# Patient Record
Sex: Female | Born: 1950
Health system: Southern US, Community
[De-identification: ages and names within clinical notes are randomized; demographics above are authoritative.]

## PROBLEM LIST (undated history)

## (undated) DIAGNOSIS — Z9989 Dependence on other enabling machines and devices: Principal | ICD-10-CM

## (undated) DIAGNOSIS — T7840XA Allergy, unspecified, initial encounter: Secondary | ICD-10-CM

## (undated) DIAGNOSIS — F32A Depression, unspecified: Secondary | ICD-10-CM

## (undated) DIAGNOSIS — G4733 Obstructive sleep apnea (adult) (pediatric): Secondary | ICD-10-CM

## (undated) DIAGNOSIS — F329 Major depressive disorder, single episode, unspecified: Secondary | ICD-10-CM

## (undated) DIAGNOSIS — S060X9A Concussion with loss of consciousness of unspecified duration, initial encounter: Secondary | ICD-10-CM

## (undated) HISTORY — DX: Obstructive sleep apnea (adult) (pediatric): G47.33

## (undated) HISTORY — DX: Concussion with loss of consciousness of unspecified duration, initial encounter: S06.0X9A

## (undated) HISTORY — DX: Major depressive disorder, single episode, unspecified: F32.9

## (undated) HISTORY — DX: Dependence on other enabling machines and devices: Z99.89

## (undated) HISTORY — DX: Depression, unspecified: F32.A

## (undated) HISTORY — PX: BLEPHAROPLASTY: SUR158

## (undated) HISTORY — DX: Allergy, unspecified, initial encounter: T78.40XA

## (undated) HISTORY — PX: TONSILLECTOMY: SUR1361

---

## 1998-04-17 ENCOUNTER — Emergency Department (HOSPITAL_COMMUNITY): Admission: EM | Admit: 1998-04-17 | Discharge: 1998-04-17 | Payer: Self-pay | Admitting: Emergency Medicine

## 2003-09-24 HISTORY — PX: OTHER SURGICAL HISTORY: SHX169

## 2013-01-11 ENCOUNTER — Ambulatory Visit (INDEPENDENT_AMBULATORY_CARE_PROVIDER_SITE_OTHER): Payer: BC Managed Care – PPO | Admitting: Emergency Medicine

## 2013-01-11 VITALS — BP 120/80 | HR 74 | Temp 98.1°F | Resp 16 | Ht 65.0 in | Wt 179.0 lb

## 2013-01-11 DIAGNOSIS — F329 Major depressive disorder, single episode, unspecified: Secondary | ICD-10-CM

## 2013-01-11 MED ORDER — PAROXETINE HCL 20 MG PO TABS: 20.0000 mg | ORAL_TABLET | ORAL | Status: DC

## 2013-01-11 NOTE — Progress Notes (Signed)
[facility] [address] [place] [address] [phone]  Date:  01/11/2013   Name:  Cathy Gallegos   DOB:  [birth year]   MRN:  [id number]  PCP:  No primary provider on file.    Chief Complaint: Allergies and Depression   History of Present Illness:  Cathy Gallegos is a 62 y.o. very pleasant female patient who presents with the following:  Experiencing vertigo since middle of March that has been poorly controlled with OTC nonsedating antihistamines.  Had similar symptoms previously.  Thinks it is related to allergies.  Also affected by depression that has worsened.  No medications currently.  Took prozac years ago and stopped it for no good reason.  No suicidal thoughts or thoughts of self harm or harm to others.  Now unemployed as she quit her job a year ago.  Gaining weight and sleeping excessively.  Wants to be by herself and is less social than she was.    Has no nasal congestion or drainage, cough, fever or chills.  No nausea or vomiting.  No weakness, wheezing or shortness of breath.  No headaches,  No improvement with over the counter medications or other home remedies. Denies other complaint or health concern today.   There is no problem list on file for this patient.   Past Medical History  Diagnosis Date  . Allergy     Past Surgical History  Procedure Laterality Date  . Cesarean section      History  Substance Use Topics  . Smoking status: Never Smoker   . Smokeless tobacco: Not on file  . Alcohol Use: No    No family history on file.  Allergies  Allergen Reactions  . Latex Swelling    Medication list has been reviewed and updated.  No current outpatient prescriptions on file prior to visit.   No current facility-administered medications on file prior to visit.    Review of Systems:  As per HPI, otherwise negative.    Physical Examination: Filed Vitals:   01/11/13 1001  BP: 120/80  Pulse: 74  Temp: 98.1 F (36.7 C)   Resp: 16   Filed Vitals:   01/11/13 1001  Height: 5\' 5"  (1.651 m)  Weight: 179 lb (81.194 kg)   Body mass index is 29.79 kg/(m^2). Ideal Body Weight: Weight in (lb) to have BMI = 25: 149.9  GEN: WDWN, NAD, Non-toxic, A & O x 3 HEENT: Atraumatic, Normocephalic. Neck supple. No masses, No LAD. Ears and Nose: No external deformity. CV: RRR, No M/G/R. No JVD. No thrill. No extra heart sounds. PULM: CTA B, no wheezes, crackles, rhonchi. No retractions. No resp. distress. No accessory muscle use. ABD: S, NT, ND, +BS. No rebound. No HSM. EXTR: No c/c/e NEURO Normal gait.  PSYCH: Normally interactive. Conversant. Not depressed or anxious appearing.  Calm demeanor.    Assessment and Plan: Depression paxil Resolved benign positional vertigo Follow up as needed  Signed,  Phillips Odor, MD

## 2013-01-11 NOTE — Patient Instructions (Addendum)

## 2013-05-20 ENCOUNTER — Ambulatory Visit (INDEPENDENT_AMBULATORY_CARE_PROVIDER_SITE_OTHER): Payer: BC Managed Care – PPO | Admitting: Family Medicine

## 2013-05-20 ENCOUNTER — Ambulatory Visit: Payer: BC Managed Care – PPO

## 2013-05-20 VITALS — BP 132/70 | HR 70 | Temp 98.2°F | Resp 18 | Ht 65.0 in | Wt 175.0 lb

## 2013-05-20 DIAGNOSIS — M25539 Pain in unspecified wrist: Secondary | ICD-10-CM

## 2013-05-20 DIAGNOSIS — M25512 Pain in left shoulder: Secondary | ICD-10-CM

## 2013-05-20 DIAGNOSIS — F329 Major depressive disorder, single episode, unspecified: Secondary | ICD-10-CM

## 2013-05-20 DIAGNOSIS — M25532 Pain in left wrist: Secondary | ICD-10-CM

## 2013-05-20 DIAGNOSIS — R42 Dizziness and giddiness: Secondary | ICD-10-CM

## 2013-05-20 DIAGNOSIS — R110 Nausea: Secondary | ICD-10-CM

## 2013-05-20 DIAGNOSIS — M25519 Pain in unspecified shoulder: Secondary | ICD-10-CM

## 2013-05-20 LAB — POCT CBC
HCT, POC: 41.3 % (ref 37.7–47.9)
Hemoglobin: 13.1 g/dL (ref 12.2–16.2)
Lymph, poc: 3.6 — AB (ref 0.6–3.4)
MCH, POC: 26.9 pg — AB (ref 27–31.2)
MCHC: 31.7 g/dL — AB (ref 31.8–35.4)
WBC: 9.4 10*3/uL (ref 4.6–10.2)

## 2013-05-20 MED ORDER — MELOXICAM 15 MG PO TABS: 15.0000 mg | ORAL_TABLET | Freq: Every day | ORAL | Status: DC

## 2013-05-20 MED ORDER — PAROXETINE HCL 20 MG PO TABS: 20.0000 mg | ORAL_TABLET | ORAL | Status: DC

## 2013-05-20 NOTE — Patient Instructions (Addendum)

## 2013-05-20 NOTE — Progress Notes (Signed)
[address]   [place], [place]  [address]   [phone]  Subjective:    Patient ID: Cathy Gallegos, female    DOB: [birth year], 62 y.o.   MRN: [id number]  HPI This 62 y.o. female presents for evaluation of the following:  1.  L shoulder pain:  Larey Seat on steps in the rain five days ago.  Has large bruise posterior upper arm.  Burning pain in L shoulder.  L wrist also hurts.  Pain radiates down arm; also having some L neck pain. +nausea from severe pain.  No n/t.  Able to move L arm but does cause pain at times.  Fell and Psychologist, sport and exercise on stairs.  Heat to area; also applied Bengay.  Took Aleve and Ibuprofen.   No swelling; feels hot.  No head trauma; no loss of consciousness; +L wrist pain; no swelling of wrist.  No elbow pain.  Pain radiates from L lateral neck to L hand.  Ribs on L also hurt some; no pain with deep breathing.  Has noticed some dizziness and unsteadiness since falling; also suffering with nausea; no vomiting; did have diarrhea and abdominal pain after fall; if anything happens to patient, it affects stomach.  No hematuria; no bloody or black stools.  2.  Depression:  Five month follow-up; management at last visit included starting Paxil 20mg  daily; feels 25% improved.  No SI.  No longer comfort eating like before starting Paxil. Weight is down 4 pounds in five months.  Decrease in sadness; widowed and lives alone; no children; not working.  Thinks all of these life components contribute to depression.  Sporadic exercise.  Not interested in increasing Paxil dose at this time.    PCP:  [facility]; no specific provider.   Review of Systems  Constitutional: Negative for fever, chills, diaphoresis and fatigue.  HENT: Positive for neck pain and neck stiffness.   Eyes: Negative for photophobia and visual disturbance.  Respiratory: Negative for shortness of breath.   Gastrointestinal: Positive for abdominal pain and diarrhea. Negative for vomiting, constipation, blood in stool, abdominal  distention and anal bleeding.  Genitourinary: Negative for hematuria.  Musculoskeletal: Positive for myalgias, back pain and arthralgias. Negative for joint swelling.  Skin: Negative for wound.  Neurological: Positive for dizziness and light-headedness. Negative for tremors, seizures, syncope, facial asymmetry, speech difficulty, weakness, numbness and headaches.  Hematological: Bruises/bleeds easily.  Psychiatric/Behavioral: Positive for dysphoric mood. Negative for suicidal ideas, confusion, sleep disturbance and self-injury. The patient is not nervous/anxious.    Past Medical History  Diagnosis Date  . Allergy   . Depression    Past Surgical History  Procedure Laterality Date  . Cesarean section    . Tonsillectomy    . Admission  09/24/2003    diverticulitis.     Allergies  Allergen Reactions  . Latex Swelling   No current outpatient prescriptions on file prior to visit.   No current facility-administered medications on file prior to visit.   History   Social History  . Marital Status: Married    Spouse Name: N/A    Number of Children: N/A  . Years of Education: N/A   Occupational History  . Not on file.   Social History Main Topics  . Smoking status: Never Smoker   . Smokeless tobacco: Not on file  . Alcohol Use: No  . Drug Use: No  . Sexual Activity: No   Other Topics Concern  . Not on file   Social History Narrative  Marital status: single/widowed.      Children: none      Lives: alone      Employment: unemployed;       Tobacco: none      Alcohol: rare      Exercise:  Walking; going to pool.        Objective:   Physical Exam  Nursing note and vitals reviewed. Constitutional: She is oriented to person, place, and time. She appears well-developed and well-nourished. No distress.  HENT:  Head: Normocephalic and atraumatic.  Eyes: Conjunctivae and EOM are normal. Pupils are equal, round, and reactive to light.  Neck: Normal range of motion. Neck  supple. No thyromegaly present.  Cardiovascular: Normal rate, regular rhythm and normal heart sounds.  Exam reveals no gallop and no friction rub.   No murmur heard. Pulmonary/Chest: Effort normal and breath sounds normal. She has no wheezes. She has no rales.  Abdominal: Soft. Bowel sounds are normal. She exhibits no distension and no mass. There is no tenderness. There is no rebound and no guarding.  Musculoskeletal:       Right shoulder: Normal.       Left shoulder: She exhibits decreased range of motion, tenderness, pain and decreased strength. She exhibits no bony tenderness, no swelling, no spasm and normal pulse.  Lymphadenopathy:    She has no cervical adenopathy.  Neurological: She is alert and oriented to person, place, and time. She has normal reflexes. No cranial nerve deficit. She exhibits normal muscle tone. Coordination normal.  Skin: Skin is warm and dry. No rash noted. She is not diaphoretic.  Psychiatric: She has a normal mood and affect. Her behavior is normal. Judgment and thought content normal.   Results for orders placed in visit on 05/20/13  POCT CBC      Result Value Range   WBC 9.4  4.6 - 10.2 K/uL   Lymph, poc 3.6 (*) 0.6 - 3.4   POC LYMPH PERCENT 38.8  10 - 50 %L   MID (cbc) 0.6  0 - 0.9   POC MID % 6.9  0 - 12 %M   POC Granulocyte 5.1  2 - 6.9   Granulocyte percent 54.3  37 - 80 %G   RBC 4.87  4.04 - 5.48 M/uL   Hemoglobin 13.1  12.2 - 16.2 g/dL   HCT, POC 16.1  09.6 - 47.9 %   MCV 84.9  80 - 97 fL   MCH, POC 26.9 (*) 27 - 31.2 pg   MCHC 31.7 (*) 31.8 - 35.4 g/dL   RDW, POC 04.5     Platelet Count, POC 268  142 - 424 K/uL   MPV 11.0  0 - 99.8 fL   [facility] reading (PRIMARY) by  Dr. Katrinka Blazing.  L WRIST: DISTAL RADIUS FRACTURE?  L SHOULDER: NAD; L HUMERUS: NAD.      Assessment & Plan:  Pain in joint, shoulder region, left - Plan: DG Humerus Left, DG Shoulder Left, meloxicam (MOBIC) 15 MG tablet, CANCELED: DG Elbow Complete Left  Dizziness and giddiness -  Plan: POCT CBC, Comprehensive metabolic panel  Nausea alone - Plan: POCT CBC, Comprehensive metabolic panel  Wrist pain, left - Plan: DG Wrist Complete Left  Depression - Plan: PARoxetine (PAXIL) 20 MG tablet   1.  Pain/Contusion L shoulder: New.  Onset after fall.  Recommend ice, rest, passive ROM with home exercises, Meloxicam.    If no improvement in 2-4 weeks, call for ortho referral. Avoid heavy lifting for  the next month. 2.  L wrist pain/sprain:  New.  Recommend rest, ice, passive ROM, Meloxicam.   3.  Dizziness:  New.  Associated with fall, pain.  Normal neurological and cardiac exams. Obtain labs. 4.  Nausea:  New. Associated with dizziness, fall.  Benign abdominal exam; no direct trauma to abdomen.  BRAT diet, hydration.  Obtain labs. 5.  Depression: stable/improved; refill of Paxil 20mg  daily provided.  Follow-up in six months.  Meds ordered this encounter  Medications  . PARoxetine (PAXIL) 20 MG tablet    Sig: Take 1 tablet (20 mg total) by mouth every morning.    Dispense:  30 tablet    Refill:  5  . meloxicam (MOBIC) 15 MG tablet    Sig: Take 1 tablet (15 mg total) by mouth daily.    Dispense:  30 tablet    Refill:  0

## 2013-05-21 LAB — COMPREHENSIVE METABOLIC PANEL
CO2: 27 mEq/L (ref 19–32)
Calcium: 9.3 mg/dL (ref 8.4–10.5)
Chloride: 103 mEq/L (ref 96–112)
Creat: 0.77 mg/dL (ref 0.50–1.10)
Glucose, Bld: 78 mg/dL (ref 70–99)
Sodium: 139 mEq/L (ref 135–145)
Total Bilirubin: 0.3 mg/dL (ref 0.3–1.2)
Total Protein: 6.6 g/dL (ref 6.0–8.3)

## 2013-06-10 MED ORDER — PAROXETINE HCL 30 MG PO TABS: 30.0000 mg | ORAL_TABLET | ORAL | Status: DC

## 2013-06-10 NOTE — Addendum Note (Signed)
Addended by: Ethelda Chick on: 06/10/2013 02:07 PM   Modules accepted: Orders

## 2013-07-22 ENCOUNTER — Ambulatory Visit (INDEPENDENT_AMBULATORY_CARE_PROVIDER_SITE_OTHER): Payer: BC Managed Care – PPO | Admitting: Emergency Medicine

## 2013-07-22 VITALS — BP 124/80 | HR 70 | Temp 98.4°F | Resp 16 | Ht 64.0 in | Wt 171.0 lb

## 2013-07-22 DIAGNOSIS — J209 Acute bronchitis, unspecified: Secondary | ICD-10-CM

## 2013-07-22 DIAGNOSIS — J018 Other acute sinusitis: Secondary | ICD-10-CM

## 2013-07-22 MED ORDER — PSEUDOEPHEDRINE-GUAIFENESIN ER 60-600 MG PO TB12: 1.0000 | ORAL_TABLET | Freq: Two times a day (BID) | ORAL | Status: AC

## 2013-07-22 MED ORDER — AMOXICILLIN-POT CLAVULANATE 875-125 MG PO TABS: 1.0000 | ORAL_TABLET | Freq: Two times a day (BID) | ORAL | Status: DC

## 2013-07-22 MED ORDER — HYDROCOD POLST-CHLORPHEN POLST 10-8 MG/5ML PO LQCR: 5.0000 mL | Freq: Two times a day (BID) | ORAL | Status: DC | PRN

## 2013-07-22 NOTE — Progress Notes (Signed)
[facility] [address] [place] [address] [phone]  Date:  07/22/2013   Name:  Cathy Gallegos   DOB:  1951/04/18   MRN:  [id number]  PCP:  No primary provider on file.    Chief Complaint: Otalgia   History of Present Illness:  Cathy Gallegos is a 62 y.o. very pleasant female patient who presents with the following:  Ill for a week.  Has nasal congestion and post nasal drip.  Has purulent nasal discharge.  Fever and chills.  Cough productive or purulent sputum.  No wheezing or shortness of breath.  No nausea or vomiting.  No stool change.  Now has pressure and fullness in ears and decreased auditory acuity.  No improvement with over the counter medications or other home remedies. Denies other complaint or health concern today.   There are no active problems to display for this patient.   Past Medical History  Diagnosis Date  . Allergy   . Depression     Past Surgical History  Procedure Laterality Date  . Cesarean section    . Tonsillectomy    . Admission  09/24/2003    diverticulitis.      History  Substance Use Topics  . Smoking status: Never Smoker   . Smokeless tobacco: Not on file  . Alcohol Use: No    Family History  Problem Relation Age of Onset  . Hypertension Brother     Allergies  Allergen Reactions  . Latex Swelling    Medication list has been reviewed and updated.  Current Outpatient Prescriptions on File Prior to Visit  Medication Sig Dispense Refill  . PARoxetine (PAXIL) 30 MG tablet Take 1 tablet (30 mg total) by mouth every morning.  30 tablet  5  . meloxicam (MOBIC) 15 MG tablet Take 1 tablet (15 mg total) by mouth daily.  30 tablet  0   No current facility-administered medications on file prior to visit.    Review of Systems:  As per HPI, otherwise negative.    Physical Examination: Filed Vitals:   07/22/13 1644  BP: 124/80  Pulse: 70  Temp: 98.4 F (36.9 C)  Resp: 16   Filed Vitals:   07/22/13  1644  Height: 5\' 4"  (1.626 m)  Weight: 171 lb (77.565 kg)   Body mass index is 29.34 kg/(m^2). Ideal Body Weight: Weight in (lb) to have BMI = 25: 145.3  GEN: WDWN, NAD, Non-toxic, A & O x 3 HEENT: Atraumatic, Normocephalic. Neck supple. No masses, No LAD. Ears and Nose: No external deformity. CV: RRR, No M/G/R. No JVD. No thrill. No extra heart sounds. PULM: CTA B, no wheezes, crackles, rhonchi. No retractions. No resp. distress. No accessory muscle use. ABD: S, NT, ND, +BS. No rebound. No HSM. EXTR: No c/c/e NEURO Normal gait.  PSYCH: Normally interactive. Conversant. Not depressed or anxious appearing.  Calm demeanor.    Assessment and Plan: Sinusitis and bronchitis mucinex  tussionex augmentin  Signed,  Phillips Odor, MD

## 2013-07-22 NOTE — Patient Instructions (Signed)

## 2014-01-04 ENCOUNTER — Ambulatory Visit (INDEPENDENT_AMBULATORY_CARE_PROVIDER_SITE_OTHER): Payer: BC Managed Care – PPO | Admitting: Family Medicine

## 2014-01-04 VITALS — BP 140/90 | HR 64 | Temp 97.8°F | Resp 16 | Ht 64.0 in | Wt 181.0 lb

## 2014-01-04 DIAGNOSIS — F329 Major depressive disorder, single episode, unspecified: Secondary | ICD-10-CM

## 2014-01-04 DIAGNOSIS — H7409 Tympanosclerosis, unspecified ear: Secondary | ICD-10-CM

## 2014-01-04 DIAGNOSIS — T7840XA Allergy, unspecified, initial encounter: Secondary | ICD-10-CM

## 2014-01-04 DIAGNOSIS — H6990 Unspecified Eustachian tube disorder, unspecified ear: Secondary | ICD-10-CM

## 2014-01-04 DIAGNOSIS — H6980 Other specified disorders of Eustachian tube, unspecified ear: Secondary | ICD-10-CM

## 2014-01-04 DIAGNOSIS — F3289 Other specified depressive episodes: Secondary | ICD-10-CM

## 2014-01-04 DIAGNOSIS — F32A Depression, unspecified: Secondary | ICD-10-CM

## 2014-01-04 MED ORDER — PAROXETINE HCL 40 MG PO TABS: 40.0000 mg | ORAL_TABLET | ORAL | Status: DC

## 2014-01-04 MED ORDER — FLUTICASONE PROPIONATE 50 MCG/ACT NA SUSP: 2.0000 | Freq: Every day | NASAL | Status: DC

## 2014-01-04 NOTE — Patient Instructions (Addendum)
Suggest seeing a Radiation protection practitionercounsellor or psychologist.  A couple of names are:  Karmen Bongoaron Stewart [phone]  Wynelle Fannyhomas Hedding [phone]  Use fluticasone nose spray 2 sprays each nostril daily  Continue the Zyrtec  Increase the Paxil to 40 mg one daily  Please return in about 2-3 months so we can see how you're doing on the dose of Paxil and decide if we need to make a definite change.  Recommend considering allergist referral if you desire. You can call [facility] and ask your plastic allergy questions.

## 2014-01-04 NOTE — Progress Notes (Signed)
Patient seen and examined. We had a long discussion about her depression. She's been on the same medication dose for at least a year. Discussed her need for counseling. Will give her some names she can contact. Increase the Paxil to 40 mg daily.  Left ear has a normal TM. Right ear has tympanosclerosis posteriorly and inferiorly. we discussed how this might be being affected by her allergies  . [facility] note reviewed and agreed on.

## 2014-01-04 NOTE — Progress Notes (Signed)
Subjective:     Patient ID: Jeanie CooksSarah E Holton, female   DOB: 1951-01-05, 63 y.o.   MRN: [id number]  Ear Fullness  Associated symptoms include hearing loss. Pertinent negatives include no abdominal pain, coughing, diarrhea, ear discharge, headaches, rhinorrhea, sore throat or vomiting.   63 YO caucasian female [facility] 3 days of constant ear pressure, decreased hearing, dizziness, and discomfort. She states that this has been an issue for her since childhood, is worse with cloudy days, and better when it is sunny. She states that she is currently taking mucinex daily and zyrtec 10 mg daily with limited to no relief. She has been seen in the past for this problem but has not gotten a definitve cause other than to be told she has allergies to dust mites. She also would like a refill on her Paxil today. She believes that the paxil 30 mg somewhat helps her depression but still ranks her depression on a scale of 6-7/10 on average and as bad as 10/10 on some days. She says it is affecting her daily life, and is sad because she lives alone and has no family. She also has no community involvement  Review of Systems  Constitutional: Positive for activity change. Negative for fever, chills, diaphoresis and appetite change.  HENT: Positive for hearing loss. Negative for ear discharge, facial swelling, postnasal drip, rhinorrhea, sinus pressure, sneezing and sore throat.        Bilateral ear congestion and pressure with decreased hearing, mild discomfort  Eyes: Negative for pain and itching.  Respiratory: Negative for cough, chest tightness, shortness of breath and wheezing.   Cardiovascular: Negative for chest pain, palpitations and leg swelling.  Gastrointestinal: Negative for nausea, vomiting, abdominal pain, diarrhea, constipation, blood in stool and abdominal distention.  Musculoskeletal: Negative for arthralgias and myalgias.  Allergic/Immunologic: Positive for environmental allergies.   Neurological: Positive for dizziness, weakness and light-headedness. Negative for syncope and headaches.       Objective:   Physical Exam  Constitutional: She is oriented to person, place, and time. She appears well-developed and well-nourished. No distress.  HENT:  Head: Normocephalic and atraumatic.  Nose: Nose normal.  Mouth/Throat: Oropharynx is clear and moist. No oropharyngeal exudate.  Eyes: Conjunctivae are normal. Pupils are equal, round, and reactive to light.  Cardiovascular: Normal rate, regular rhythm, normal heart sounds and intact distal pulses.  Exam reveals no gallop and no friction rub.   No murmur heard. Pulmonary/Chest: Effort normal and breath sounds normal. No respiratory distress. She has no wheezes. She has no rales. She exhibits no tenderness.  Abdominal: Soft. Bowel sounds are normal. She exhibits no distension. There is no tenderness. There is no rebound and no guarding.  Neurological: She is alert and oriented to person, place, and time.  Skin: Skin is warm and dry. She is not diaphoretic. No erythema.  Psychiatric: She has a normal mood and affect. Her behavior is normal.       Assessment:    Depression - Plan: PARoxetine (PAXIL) 40 MG tablet  Tympanosclerosis - Plan: fluticasone (FLONASE) 50 MCG/ACT nasal spray  Allergy  Eustachian tube dysfunction       Plan:    increase paroxitine to 40 mg daily and return in 2-3 months to re-check Suggest seeing a counsellor or psychologist.  A couple of names are:  Karmen Bongoaron Stewart [phone]  Wynelle Fannyhomas Hedding [phone] Discussed in detail the need to get involved in the community and gave several options for outlets into the  community  Use fluticasone nose spray 2 sprays each nostril daily  Continue the Zyrtec  Recommend considering allergist referral if you desire. You can call [facility] and ask your plastic allergy questions.        Discussed and reviewed noted and agreed upon Sandria Balesavid H.  Alwyn RenHopper MD

## 2014-06-13 ENCOUNTER — Other Ambulatory Visit: Payer: Self-pay | Admitting: Family Medicine

## 2014-06-21 ENCOUNTER — Ambulatory Visit (INDEPENDENT_AMBULATORY_CARE_PROVIDER_SITE_OTHER): Payer: BC Managed Care – PPO | Admitting: Family Medicine

## 2014-06-21 VITALS — BP 138/72 | HR 86 | Temp 97.8°F | Resp 16 | Ht 65.0 in | Wt 172.2 lb

## 2014-06-21 DIAGNOSIS — F3289 Other specified depressive episodes: Secondary | ICD-10-CM

## 2014-06-21 DIAGNOSIS — J309 Allergic rhinitis, unspecified: Secondary | ICD-10-CM

## 2014-06-21 DIAGNOSIS — J302 Other seasonal allergic rhinitis: Secondary | ICD-10-CM

## 2014-06-21 DIAGNOSIS — Z23 Encounter for immunization: Secondary | ICD-10-CM

## 2014-06-21 DIAGNOSIS — F32A Depression, unspecified: Secondary | ICD-10-CM

## 2014-06-21 DIAGNOSIS — F329 Major depressive disorder, single episode, unspecified: Secondary | ICD-10-CM

## 2014-06-21 MED ORDER — FLUTICASONE PROPIONATE 50 MCG/ACT NA SUSP: 2.0000 | Freq: Every day | NASAL | Status: DC

## 2014-06-21 MED ORDER — PAROXETINE HCL 40 MG PO TABS: ORAL_TABLET | ORAL | Status: DC

## 2014-06-21 MED ORDER — CETIRIZINE HCL 10 MG PO TABS: 10.0000 mg | ORAL_TABLET | Freq: Every day | ORAL | Status: DC

## 2014-06-21 NOTE — Patient Instructions (Signed)
Continue current medications  Return in 4-6 months for a followup  Continue seeing your counsellor/psychologist

## 2014-06-21 NOTE — Progress Notes (Signed)
Subjective: Patient is here for recheck of her medications. She's done pretty well. She's been seeing a psychologist and doing better since I saw her last time. She had a previous similar. She continues taking the Paxil 40 mg one daily. She does some volunteer activities. Gloomy rainy days like this are well.  Objective: Pleasant alert lady in no acute distress. TMs normal. Throat clear. Neck supple without nodes or thyromegaly. Chest clear. Heart regular without murmurs. No carotid bruits. Abdomen soft and nontender. Extremities without edema.  Assessment: Depression, stable  Plan: Refill her medications. Flu shot

## 2015-03-06 ENCOUNTER — Ambulatory Visit (INDEPENDENT_AMBULATORY_CARE_PROVIDER_SITE_OTHER): Payer: 59 | Admitting: Family Medicine

## 2015-03-06 VITALS — BP 118/80 | HR 63 | Temp 97.5°F | Resp 18 | Ht 64.25 in | Wt 171.4 lb

## 2015-03-06 DIAGNOSIS — F329 Major depressive disorder, single episode, unspecified: Secondary | ICD-10-CM

## 2015-03-06 DIAGNOSIS — F32A Depression, unspecified: Secondary | ICD-10-CM

## 2015-03-06 MED ORDER — PAROXETINE HCL 40 MG PO TABS: ORAL_TABLET | ORAL | Status: DC

## 2015-03-06 NOTE — Patient Instructions (Signed)
You may want to discuss Medicare benefits with Cathy Gallegos whose email address is [email or web address]

## 2015-03-06 NOTE — Progress Notes (Signed)
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 She's been on Paxil for many years, but ran out of her medicine recently when her insurance lapsed. She's had extreme nausea and vomiting as well as flulike symptoms since she ran out of her medicine.  In addition patient has had quite a bit of stress with some family members dying.  Objective: Patient is in no acute distress HEENT is normal There is no diaphoresis   This chart was scribed in my presence and reviewed by me personally.    ICD-9-CM ICD-10-CM   1. Depression 311 F32.9 PARoxetine (PAXIL) 40 MG tablet     Signed, Elvina Sidle, MD

## 2015-06-16 ENCOUNTER — Ambulatory Visit (INDEPENDENT_AMBULATORY_CARE_PROVIDER_SITE_OTHER): Payer: 59 | Admitting: Family Medicine

## 2015-06-16 ENCOUNTER — Encounter: Payer: Self-pay | Admitting: Gastroenterology

## 2015-06-16 VITALS — BP 130/80 | HR 76 | Temp 98.1°F | Resp 18 | Ht 64.0 in | Wt 176.0 lb

## 2015-06-16 DIAGNOSIS — Z23 Encounter for immunization: Secondary | ICD-10-CM

## 2015-06-16 DIAGNOSIS — M25512 Pain in left shoulder: Secondary | ICD-10-CM | POA: Diagnosis not present

## 2015-06-16 DIAGNOSIS — J302 Other seasonal allergic rhinitis: Secondary | ICD-10-CM

## 2015-06-16 DIAGNOSIS — R5382 Chronic fatigue, unspecified: Secondary | ICD-10-CM | POA: Diagnosis not present

## 2015-06-16 DIAGNOSIS — Z0000 Encounter for general adult medical examination without abnormal findings: Secondary | ICD-10-CM

## 2015-06-16 LAB — POCT CBC
Granulocyte percent: 59.2 %G (ref 37–80)
HCT, POC: 42.6 % (ref 37.7–47.9)
Hemoglobin: 13.4 g/dL (ref 12.2–16.2)
Lymph, poc: 2.6 (ref 0.6–3.4)
MCH, POC: 25.4 pg — AB (ref 27–31.2)
MCHC: 31.4 g/dL — AB (ref 31.8–35.4)
MCV: 80.9 fL (ref 80–97)
MID (cbc): 0.4 (ref 0–0.9)
MPV: 8.1 fL (ref 0–99.8)
POC Granulocyte: 4.4 (ref 2–6.9)
POC LYMPH PERCENT: 34.9 %L (ref 10–50)
POC MID %: 5.9 %M (ref 0–12)
Platelet Count, POC: 263 10*3/uL (ref 142–424)
RBC: 5.27 M/uL (ref 4.04–5.48)
RDW, POC: 15.1 %
WBC: 7.5 10*3/uL (ref 4.6–10.2)

## 2015-06-16 LAB — TSH: TSH: 2.843 u[IU]/mL (ref 0.350–4.500)

## 2015-06-16 LAB — LIPID PANEL
Cholesterol: 194 mg/dL (ref 125–200)
HDL: 60 mg/dL (ref >=46)
LDL Cholesterol: 114 mg/dL (ref <130)
Total CHOL/HDL Ratio: 3.2 Ratio (ref <=5.0)
Triglycerides: 101 mg/dL (ref <150)
VLDL: 20 mg/dL (ref <30)

## 2015-06-16 LAB — COMPLETE METABOLIC PANEL WITH GFR
ALT: 11 U/L (ref 6–29)
AST: 12 U/L (ref 10–35)
Albumin: 3.9 g/dL (ref 3.6–5.1)
Alkaline Phosphatase: 92 U/L (ref 33–130)
BUN: 15 mg/dL (ref 7–25)
CO2: 24 mmol/L (ref 20–31)
Calcium: 8.9 mg/dL (ref 8.6–10.4)
Chloride: 106 mmol/L (ref 98–110)
Creat: 0.78 mg/dL (ref 0.50–0.99)
GFR, Est African American: >89 mL/min (ref >=60)
GFR, Est Non African American: 81 mL/min (ref >=60)
Glucose, Bld: 94 mg/dL (ref 65–99)
Potassium: 4 mmol/L (ref 3.5–5.3)
Sodium: 142 mmol/L (ref 135–146)
Total Bilirubin: 0.4 mg/dL (ref 0.2–1.2)
Total Protein: 6.3 g/dL (ref 6.1–8.1)

## 2015-06-16 MED ORDER — MELOXICAM 15 MG PO TABS: 15.0000 mg | ORAL_TABLET | Freq: Every day | ORAL | Status: DC

## 2015-06-16 MED ORDER — CETIRIZINE HCL 10 MG PO TABS: 10.0000 mg | ORAL_TABLET | Freq: Every day | ORAL | Status: DC

## 2015-06-16 NOTE — Addendum Note (Signed)
Addended by: Nita Sells on: 06/16/2015 10:01 AM   Modules accepted: Orders

## 2015-06-16 NOTE — Patient Instructions (Signed)

## 2015-06-16 NOTE — Progress Notes (Signed)
   Subjective:    Patient ID: Cathy Gallegos, female    DOB: 10/31/1950, 64 y.o.   MRN: [id number] This chart was scribed for Elvina Sidle, MD by Littie Deeds, Medical Scribe. This patient was seen in Room 11 and the patient's care was started at 9:34 AM.   HPI HPI Comments: Cathy Gallegos is a 64 y.o. female who [facility] medication refill and blood work. Patient states she has not had blood work in a long time. She has had some fatigue. Patient has also been having generalized arthralgias and wants to have a DEXA scan. She denies known history of elevated cholesterol. She believes her last colonoscopy was in 2005. She is not UTD on mammogram.  She started a part-time job this past January and is on her feet often.   Patient also needs refill on her Zyrtec. Her allergies bother her periodically. She's not having any significant cough or nasal discharge at this time, and denies fever.  Review of Systems  Constitutional: Positive for fatigue.  Musculoskeletal: Positive for arthralgias.       Objective:   Physical Exam CONSTITUTIONAL: Well developed/well nourished HEAD: Normocephalic/atraumatic EYES: EOM/PERRL ENMT: Mucous membranes moist NECK: supple no meningeal signs SPINE: entire spine nontender CV: S1/S2 noted, no murmurs/rubs/gallops noted LUNGS: Lungs are clear to auscultation bilaterally, no apparent distress ABDOMEN: soft, nontender, no rebound or guarding GU: no cva tenderness NEURO: Pt is awake/alert, moves all extremitiesx4 EXTREMITIES: pulses normal, full ROM SKIN: warm, color normal PSYCH: no abnormalities of mood noted     Assessment & Plan:    By signing my name below, I, Littie Deeds, attest that this documentation has been prepared under the direction and in the presence of Elvina Sidle, MD.  Electronically Signed: Littie Deeds, Medical Scribe. 06/16/2015. 9:34 AM.  This chart was scribed in my presence and reviewed  by me personally.    ICD-9-CM ICD-10-CM   1. Pain in joint, shoulder region, left 719.41 M25.512 meloxicam (MOBIC) 15 MG tablet     Vit D  25 hydroxy (rtn osteoporosis monitoring)     COMPLETE METABOLIC PANEL WITH GFR     TSH     DG DXA FRACTURE ASSESSMENT  2. Chronic fatigue 780.79 R53.82 POCT CBC     COMPLETE METABOLIC PANEL WITH GFR     Lipid panel     TSH  3. Routine general medical examination at a health care facility V70.0 Z00.00 Ambulatory referral to Gastroenterology  4. Seasonal allergic rhinitis 477.9 J30.2   5. Seasonal allergies 477.9 J30.2 cetirizine (ZYRTEC) 10 MG tablet     Signed, Elvina Sidle, MD

## 2015-06-17 LAB — VITAMIN D 25 HYDROXY (VIT D DEFICIENCY, FRACTURES): Vit D, 25-Hydroxy: 20 ng/mL — ABNORMAL LOW (ref 30–100)

## 2015-06-19 ENCOUNTER — Other Ambulatory Visit: Payer: Self-pay

## 2015-06-19 DIAGNOSIS — E2839 Other primary ovarian failure: Secondary | ICD-10-CM

## 2015-06-20 ENCOUNTER — Ambulatory Visit (AMBULATORY_SURGERY_CENTER): Payer: Self-pay

## 2015-06-20 VITALS — Ht 64.0 in | Wt 176.8 lb

## 2015-06-20 DIAGNOSIS — Z1211 Encounter for screening for malignant neoplasm of colon: Secondary | ICD-10-CM

## 2015-06-20 MED ORDER — MOVIPREP 100 G PO SOLR: 1.0000 | Freq: Once | ORAL | Status: DC

## 2015-06-20 NOTE — Progress Notes (Signed)
No allergies to eggs or soy No home oxygen No diet/weight loss meds No past problems with anesthesia  Refused emmi 

## 2015-06-21 ENCOUNTER — Encounter: Payer: Self-pay | Admitting: Family Medicine

## 2015-06-30 ENCOUNTER — Ambulatory Visit (AMBULATORY_SURGERY_CENTER): Payer: 59 | Admitting: Gastroenterology

## 2015-06-30 ENCOUNTER — Encounter: Payer: Self-pay | Admitting: Gastroenterology

## 2015-06-30 VITALS — BP 96/56 | HR 60 | Temp 97.5°F | Resp 22 | Ht 64.0 in | Wt 176.0 lb

## 2015-06-30 DIAGNOSIS — K5730 Diverticulosis of large intestine without perforation or abscess without bleeding: Secondary | ICD-10-CM

## 2015-06-30 DIAGNOSIS — Z1211 Encounter for screening for malignant neoplasm of colon: Secondary | ICD-10-CM | POA: Diagnosis not present

## 2015-06-30 MED ORDER — SODIUM CHLORIDE 0.9 % IV SOLN: 500.0000 mL | INTRAVENOUS | Status: DC

## 2015-06-30 NOTE — Op Note (Signed)
[facility] [address] [place], [address]   COLONOSCOPY PROCEDURE REPORT  PATIENT: Cathy Gallegos, Cathy Gallegos  MR#: [id number] BIRTHDATE: 1951/05/18 , 63  yrs. old GENDER: female ENDOSCOPIST: Rachael Fee, MD REFERRED XB:JYNW Lauenstein, M.D. PROCEDURE DATE:  06/30/2015 PROCEDURE:   Colonoscopy, screening First Screening Colonoscopy - Avg.  risk and is 50 yrs.  old or older - No.  Prior Negative Screening - Now for repeat screening. 10 or more years since last screening  History of Adenoma - Now for follow-up colonoscopy & has been > or = to 3 yrs.  N/A  Recommend repeat exam, <10 yrs? No ASA CLASS:   Class II INDICATIONS:Screening for colonic neoplasia, Colorectal Neoplasm Risk Assessment for this procedure is average risk, and Colonoscopy in [place] showed no polyps (per patient). MEDICATIONS: Monitored anesthesia care and Propofol 250 mg IV  DESCRIPTION OF PROCEDURE:   After the risks benefits and alternatives of the procedure were thoroughly explained, informed consent was obtained.  The digital rectal exam revealed no abnormalities of the rectum.   The [id number]  endoscope was introduced through the anus and advanced to the cecum, which was identified by both the appendix and ileocecal valve. No adverse events experienced.   The quality of the prep was adequate  The instrument was then slowly withdrawn as the colon was fully examined. Estimated blood loss is zero unless otherwise noted in this procedure report.   COLON FINDINGS: There was moderate diverticulosis noted in the left colon.   The examination was otherwise normal.  Retroflexed views revealed no abnormalities. The time to cecum = 2.7 Withdrawal time = 6.1   The scope was withdrawn and the procedure completed. COMPLICATIONS: There were no immediate complications.  ENDOSCOPIC IMPRESSION: 1.   Moderate diverticulosis was noted in the left colon 2.   The examination was otherwise  normal; no polyps or cancers  RECOMMENDATIONS: You should continue to follow colorectal cancer screening guidelines for "routine risk" patients with a repeat colonoscopy in 10 years.   eSigned:  Rachael Fee, MD 06/30/2015 7:44 AM

## 2015-06-30 NOTE — Progress Notes (Signed)
Transferred to recovery room. A/O x3, pleased with MAC.  VSS.  Report to Jane, RN. 

## 2015-06-30 NOTE — Patient Instructions (Signed)
YOU HAD AN ENDOSCOPIC PROCEDURE TODAY AT THE [facility]:   Refer to the procedure report that was given to you for any specific questions about what was found during the examination.  If the procedure report does not answer your questions, please call your gastroenterologist to clarify.  If you requested that your care partner not be given the details of your procedure findings, then the procedure report has been included in a sealed envelope for you to review at your convenience later.  YOU SHOULD EXPECT: Some feelings of bloating in the abdomen. Passage of more gas than usual.  Walking can help get rid of the air that was put into your GI tract during the procedure and reduce the bloating. If you had a lower endoscopy (such as a colonoscopy or flexible sigmoidoscopy) you may notice spotting of blood in your stool or on the toilet paper. If you underwent a bowel prep for your procedure, you may not have a normal bowel movement for a few days.  Please Note:  You might notice some irritation and congestion in your nose or some drainage.  This is from the oxygen used during your procedure.  There is no need for concern and it should clear up in a day or so.  SYMPTOMS TO REPORT IMMEDIATELY:   Following lower endoscopy (colonoscopy or flexible sigmoidoscopy):  Excessive amounts of blood in the stool  Significant tenderness or worsening of abdominal pains  Swelling of the abdomen that is new, acute  Fever of 100F or higher    For urgent or emergent issues, a gastroenterologist can be reached at any hour by calling [phone].   DIET: Your first meal following the procedure should be a small meal and then it is ok to progress to your normal diet. Heavy or fried foods are harder to digest and may make you feel nauseous or bloated.  Likewise, meals heavy in dairy and vegetables can increase bloating.  Drink plenty of fluids but you should avoid alcoholic beverages for 24  hours.  ACTIVITY:  You should plan to take it easy for the rest of today and you should NOT DRIVE or use heavy machinery until tomorrow (because of the sedation medicines used during the test).    FOLLOW UP: Our staff will call the number listed on your records the next business day following your procedure to check on you and address any questions or concerns that you may have regarding the information given to you following your procedure. If we do not reach you, we will leave a message.  However, if you are feeling well and you are not experiencing any problems, there is no need to return our call.  We will assume that you have returned to your regular daily activities without incident.  If any biopsies were taken you will be contacted by phone or by letter within the next 1-3 weeks.  Please call us at [phone] if you have not heard about the biopsies in 3 weeks.    SIGNATURES/CONFIDENTIALITY: You and/or your care partner have signed paperwork which will be entered into your electronic medical record.  These signatures attest to the fact that that the information above on your After Visit Summary has been reviewed and is understood.  Full responsibility of the confidentiality of this discharge information lies with you and/or your care-partner.  Diverticulosis and high fiber diet information given.  Next colonoscopy 10 years-2026. 

## 2015-07-03 ENCOUNTER — Telehealth: Payer: Self-pay | Admitting: *Deleted

## 2015-07-03 NOTE — Telephone Encounter (Signed)
  Follow up Call-  Call back number 06/30/2015  Post procedure Call Back phone  # [phone]  Permission to leave phone message Yes     Patient questions:  Do you have a fever, pain , or abdominal swelling? No. Pain Score  0 *  Have you tolerated food without any problems? Yes.    Have you been able to return to your normal activities? Yes.    Do you have any questions about your discharge instructions: Diet   No. Medications  No. Follow up visit  No.  Do you have questions or concerns about your Care? No.  Actions: * If pain score is 4 or above: No action needed, pain <4.

## 2015-09-04 ENCOUNTER — Ambulatory Visit (INDEPENDENT_AMBULATORY_CARE_PROVIDER_SITE_OTHER): Payer: PPO

## 2015-09-04 ENCOUNTER — Ambulatory Visit (INDEPENDENT_AMBULATORY_CARE_PROVIDER_SITE_OTHER): Payer: PPO | Admitting: Family Medicine

## 2015-09-04 VITALS — BP 134/86 | HR 83 | Temp 97.7°F | Resp 18 | Ht 64.0 in | Wt 182.0 lb

## 2015-09-04 DIAGNOSIS — M545 Low back pain, unspecified: Secondary | ICD-10-CM

## 2015-09-04 DIAGNOSIS — N3944 Nocturnal enuresis: Secondary | ICD-10-CM | POA: Diagnosis not present

## 2015-09-04 LAB — POCT URINALYSIS DIP (MANUAL ENTRY)
Bilirubin, UA: NEGATIVE
Glucose, UA: NEGATIVE
Ketones, POC UA: NEGATIVE
LEUKOCYTES UA: NEGATIVE
NITRITE UA: NEGATIVE
PH UA: 6
Protein Ur, POC: NEGATIVE
RBC UA: NEGATIVE
Spec Grav, UA: 1.025
UROBILINOGEN UA: 0.2

## 2015-09-04 LAB — POC MICROSCOPIC URINALYSIS (UMFC): MUCUS RE: ABSENT

## 2015-09-04 NOTE — Progress Notes (Signed)
[facility] [address], [place] [address] [phone]  Date:  09/04/2015   Name:  Cathy Gallegos   DOB:  1950/11/17   MRN:  [id number]  PCP:  Elvina Sidle, MD    Chief Complaint: Urinary Tract Infection; Depression; and Leg Pain   History of Present Illness:  Cathy Gallegos is a 64 y.o. very pleasant female patient who presents with the following:  Here today with several concerns- she has noted lower back ache (however she has been lifting a lot recenlty).  She also notes that she is "wetting the bed."  She is actually emptying her bladder completely while asleep    She has noted the lower back pain off an on a couple of months ago, more persistent for a week.  It is worse over the last week.   She notes some pain down her right leg- this is not really new but is worse.   She can tell the right leg is not quite as strong as the other leg but is not aware of any true numbness or specific weakness.   She has noted new onset of betwetting for a month or so- she may wake up or may sleep through.  This has occured nightly over the last 2-4 weeks. During the day she may leak if she needs to urinate and can't make it to the bathroom.  She may have some touble holding her bladder. She also notes that sometimes she will wake up in the am and urinate on herself before she can get to the bathroom. No incont of stool  She does not take any sleeping pills, no drugs, no alcohol.    She did fall a couple of months ago- did not think that she hurt her back however.   She does admit that she is depressed but denies any plans to self harm- "I'm ok"  There are no active problems to display for this patient.   Past Medical History  Diagnosis Date  . Allergy   . Depression     Past Surgical History  Procedure Laterality Date  . Cesarean section    . Tonsillectomy    . Admission  09/24/2003    diverticulitis.      Social History  Substance Use Topics  . Smoking  status: Never Smoker   . Smokeless tobacco: Never Used  . Alcohol Use: No    Family History  Problem Relation Age of Onset  . Hypertension Brother   . Colon cancer Neg Hx   . Esophageal cancer Neg Hx   . Stomach cancer Neg Hx   . Rectal cancer Neg Hx   . Throat cancer Father     Allergies  Allergen Reactions  . Latex Swelling    Medication list has been reviewed and updated.  Current Outpatient Prescriptions on File Prior to Visit  Medication Sig Dispense Refill  . cetirizine (ZYRTEC) 10 MG tablet Take 1 tablet (10 mg total) by mouth daily. 60 tablet 5  . PARoxetine (PAXIL) 40 MG tablet Take one daily for depression 30 tablet 11  . fluticasone (FLONASE) 50 MCG/ACT nasal spray Place 2 sprays into both nostrils daily. (Patient not taking: Reported on 09/04/2015) 16 g 5  . meloxicam (MOBIC) 15 MG tablet Take 1 tablet (15 mg total) by mouth daily. (Patient not taking: Reported on 09/04/2015) 90 tablet 3   No current facility-administered medications on file prior to visit.    Review of Systems:  As per HPI- otherwise negative.  Physical Examination: Filed Vitals:   09/04/15 1320  BP: 134/86  Pulse: 83  Temp: 97.7 F (36.5 C)  Resp: 18   Filed Vitals:   09/04/15 1320  Height: 5\' 4"  (1.626 m)  Weight: 182 lb (82.555 kg)   Body mass index is 31.22 kg/(m^2). Ideal Body Weight: Weight in (lb) to have BMI = 25: 145.3  GEN: WDWN, NAD, Non-toxic, A & O x 3, obese, looks well HEENT: Atraumatic, Normocephalic. Neck supple. No masses, No LAD. Ears and Nose: No external deformity. CV: RRR, No M/G/R. No JVD. No thrill. No extra heart sounds. PULM: CTA B, no wheezes, crackles, rhonchi. No retractions. No resp. distress. No accessory muscle use. ABD: S, NT, ND EXTR: No c/c/e NEURO Normal gait.  PSYCH: Normally interactive. Conversant. Not depressed or anxious appearing.  Calm demeanor.  Rectal tone is reduced, on exam she is not able to squeeze my finger with her sphincter  muscle.   No saddle anesthesia however on exam Negative bilateral striaght leg raise.  Hyperreflexive at patella on the left, strong on the right Normal BLE strength  Results for orders placed or performed in visit on 09/04/15  POCT Microscopic Urinalysis ([facility])  Result Value Ref Range   WBC,UR,HPF,POC Few (A) None WBC/hpf   RBC,UR,HPF,POC None None RBC/hpf   Bacteria None None, Too numerous to count   Mucus Absent Absent   Epithelial Cells, UR Per Microscopy Few (A) None, Too numerous to count cells/hpf  POCT urinalysis dipstick  Result Value Ref Range   Color, UA yellow yellow   Clarity, UA clear clear   Glucose, UA negative negative   Bilirubin, UA negative negative   Ketones, POC UA negative negative   Spec Grav, UA 1.025    Blood, UA negative negative   pH, UA 6.0    Protein Ur, POC negative negative   Urobilinogen, UA 0.2    Nitrite, UA Negative Negative   Leukocytes, UA Negative Negative   [facility] reading (PRIMARY) by  Dr. Patsy Lageropland. Lumbar spine: compression at T12,loss of lordosis, ow negative   LUMBAR SPINE - COMPLETE 4+ VIEW  COMPARISON: No priors.  FINDINGS: Five views of the lumbar spine demonstrate a compression fracture of T12 with approximately 15-20% loss of anterior vertebral body height, likely chronic. No definite acute displaced fractures are identified. Mild levoscoliosis of the lumbar spine convex to the left at the level of L3. Multilevel degenerative disc disease, most evident at L3-L4. Multilevel facet arthropathy, most severe at L5-S1. No defects of the pars interarticularis are noted.  IMPRESSION: 1. No acute radiographic abnormality of the lumbar spine. 2. Chronic appearing compression fracture of T12 superior endplate with approximately 15-20% loss of anterior vertebral body height. 3. Multilevel degenerative disc disease, spondylosis and mild levoscoliosis, as detailed above  She recently had a CMP and CBC that were normal   Called and  discussed with neurosurgeon on call- plan for an MRI in the next 1-2 days.  Hold off on prednisone given duration of sx and they are atypical for CES.   Assessment and Plan: Right-sided low back pain without sciatica - Plan: MR Lumbar Spine Wo Contrast  Bed wetting - Plan: POCT Microscopic Urinalysis ([facility]), POCT urinalysis dipstick, DG Lumbar Spine Complete, Urine culture, MR Lumbar Spine Wo Contrast  Plan for MRI asap.  Her sx are not really consistent with CES and have gone on for a few weeks at this point so will not start steroids now.  If her MRI is normal plan to refer to urology Asked pt to let me know if she does not hear about her MRI appt soon, and to follow-up and see [place] regarding her depression soon  Signed Abbe Amsterdam, MD

## 2015-09-06 LAB — URINE CULTURE

## 2015-09-08 ENCOUNTER — Ambulatory Visit
Admission: RE | Admit: 2015-09-08 | Discharge: 2015-09-08 | Disposition: A | Discharge status: Home / Self Care | Payer: PPO | Source: Ambulatory Visit | Attending: Family Medicine | Admitting: Family Medicine

## 2015-09-08 DIAGNOSIS — M545 Low back pain, unspecified: Secondary | ICD-10-CM

## 2015-09-08 DIAGNOSIS — N3944 Nocturnal enuresis: Secondary | ICD-10-CM

## 2015-09-09 ENCOUNTER — Telehealth: Payer: Self-pay | Admitting: Family Medicine

## 2015-09-09 DIAGNOSIS — M5441 Lumbago with sciatica, right side: Secondary | ICD-10-CM

## 2015-09-09 MED ORDER — PREDNISONE 20 MG PO TABS: ORAL_TABLET | ORAL | Status: DC

## 2015-09-09 NOTE — Telephone Encounter (Signed)
Called her to go over her sx- she is about the same.  She would like to use a steroid taper.  She does not have DM.  She has used prednisone in the past without difficulty.   Will refer to her neurosurgery as well

## 2015-09-16 ENCOUNTER — Telehealth: Payer: Self-pay | Admitting: Family Medicine

## 2015-09-16 ENCOUNTER — Other Ambulatory Visit: Payer: Self-pay | Admitting: Family Medicine

## 2015-09-16 NOTE — Telephone Encounter (Signed)
Called to check on her- she is doing better, bedwetting has stopped with the pred.  She will let me know if it comes back after she finishes her course

## 2015-09-18 ENCOUNTER — Telehealth: Payer: Self-pay

## 2015-09-18 DIAGNOSIS — M5441 Lumbago with sciatica, right side: Secondary | ICD-10-CM

## 2015-09-18 MED ORDER — PREDNISONE 20 MG PO TABS: ORAL_TABLET | ORAL | Status: DC

## 2015-09-18 NOTE — Telephone Encounter (Signed)
Called her- since she went down from 40 mg to 20 mg of prednisone her urinary sx have returned.  Will go back on 40 mg of prednisone and contact [facility] for her tomorrow- no one is open today

## 2015-09-18 NOTE — Telephone Encounter (Signed)
Spoke with pt, she states she is still having the same problems from the last office visit. The back pain and bed wetting I believe. She was not able to talk because she is around other people. She had a lot of stress going on yesterday and does not know if this contributed to her problem. Pt will be available after 5pm today.

## 2015-09-18 NOTE — Telephone Encounter (Signed)
Pt would like to speak with Dr Patsy Lageropland about a recurring problem she is having. Please call [phone]

## 2015-09-19 ENCOUNTER — Telehealth: Payer: Self-pay | Admitting: Family Medicine

## 2015-09-19 ENCOUNTER — Telehealth: Payer: Self-pay

## 2015-09-19 NOTE — Telephone Encounter (Signed)
I left a message with [facility] to set up an appt for Cathy Gallegos. They are out of office for the holiday and I was unable to speak with a live person.

## 2015-09-20 NOTE — Telephone Encounter (Signed)
[facility] is trying to reach the patient to schedule but is unable to leave messages on the patient's listed phone numbers. If patient calls, please inform patient to contact [facility] at [phone].

## 2015-09-20 NOTE — Telephone Encounter (Signed)
Renee was able to reach someone at [facility] who stated they will call her and schedule an appt.  Called to let her know- she did not wet the bed last night with the higher dose of prednisone again.  She will alert me if she does not hear about her neurosurgery appt soon Called and discussed with radiologist who read her MRI to make sure no sign of CES- he confirmed that there is not

## 2015-09-28 NOTE — Telephone Encounter (Signed)
Called pt- she had not yet connected with [facility].  Advised her to call them and gave phone number.  She plans to do so

## 2015-09-29 ENCOUNTER — Telehealth: Payer: Self-pay

## 2015-09-29 NOTE — Telephone Encounter (Signed)
Message is for Dr. Patsy Lageropland, she made her appointment for neurologist next Thursdays with Dr. Jordan LikesPool.

## 2015-10-02 NOTE — Telephone Encounter (Signed)
Great- he is with [facility]

## 2015-10-05 ENCOUNTER — Telehealth: Payer: Self-pay

## 2015-10-05 DIAGNOSIS — G959 Disease of spinal cord, unspecified: Secondary | ICD-10-CM | POA: Diagnosis not present

## 2015-10-05 NOTE — Telephone Encounter (Signed)
Pt would like Dr. Patsy Lageropland to know that she saw the Neurosurgeon today, and they will be referring her to a neurologist.

## 2015-10-16 ENCOUNTER — Encounter: Payer: Self-pay | Admitting: Neurology

## 2015-10-16 ENCOUNTER — Ambulatory Visit (INDEPENDENT_AMBULATORY_CARE_PROVIDER_SITE_OTHER): Payer: PPO | Admitting: Neurology

## 2015-10-16 ENCOUNTER — Other Ambulatory Visit: Payer: Self-pay | Admitting: *Deleted

## 2015-10-16 VITALS — BP 135/83 | HR 78 | Resp 16 | Ht 64.0 in | Wt 182.0 lb

## 2015-10-16 DIAGNOSIS — R269 Unspecified abnormalities of gait and mobility: Secondary | ICD-10-CM | POA: Diagnosis not present

## 2015-10-16 DIAGNOSIS — N3941 Urge incontinence: Secondary | ICD-10-CM | POA: Insufficient documentation

## 2015-10-16 DIAGNOSIS — N39498 Other specified urinary incontinence: Secondary | ICD-10-CM

## 2015-10-16 DIAGNOSIS — R32 Unspecified urinary incontinence: Secondary | ICD-10-CM

## 2015-10-16 HISTORY — DX: Unspecified abnormalities of gait and mobility: R26.9

## 2015-10-16 HISTORY — DX: Unspecified urinary incontinence: R32

## 2015-10-16 NOTE — Progress Notes (Signed)
PATIENT: Cathy Gallegos DOB: [birth year]  Chief Complaint  Patient presents with  . Myelopathy    Rm 5 New Patient. Patient reports that she has had various symptoms for the past 2 months. Urinary incontinence, fatigue, R leg and arm pain, back pain, and h/o falls. Reports that she has been under a lot of stress recently as well.      HISTORICAL  Cathy Gallegos is a 65 years old right-handed female, alone at visit, seen in refer by  Neurosurgeon Dr. Julio Sicks, primary care physician Dr. Elvina Sidle for evaluation of constellation of complaints, this includes urinary incontinence, fatigue, right arm, leg pain, low back pain, history of falling  She has chronic low back pain for many years, it is difficult for her to sit on hard surface, since she fell in September 2016, she began to notice increased to right side low back pain, radiating pain to right leg, she also noticed mild unsteady gait, she lives alone, often felt lonely, around November 2016, she was startled to find herself woke up almost every night has wet herself, she denies bowel incontinence, she does have progressive worsening daytime urinary urgency, occasionally accident.  Overall her nightly urinary accident has improved since January 2017 She was seen by her PCP, who ordered MRI, I have personally reviewed MRI of lumbar in December 2016: Small disc protrusion in and lateral to the right neural foramen at L3-4 touching the right L3 nerve. Small disc bulge lateral to the left neural foramen at L2-3 touching the left L2 nerve. Bilateral moderate facet arthritis at L4-5.  She  denies significant neck pain, no bilateral upper extremity paresthesia or weakness   REVIEW OF SYSTEMS: Full 14 system review of systems performed and notable only for fatigue, lack of energy, difficulty swallowing  ALLERGIES: Allergies  Allergen Reactions  . Dust Mite Mixed Allergen Ext [Mite (D. Farinae)]   . Latex Swelling  . Molds & Smuts      HOME MEDICATIONS: Current Outpatient Prescriptions  Medication Sig Dispense Refill  . cetirizine (ZYRTEC) 10 MG tablet Take 1 tablet (10 mg total) by mouth daily. 60 tablet 5  . fluticasone (FLONASE) 50 MCG/ACT nasal spray USE 2 SPRAYS IN BOTH NOSTRILS DAILY 16 g 0  . meloxicam (MOBIC) 15 MG tablet   3  . PARoxetine (PAXIL) 40 MG tablet Take one daily for depression 30 tablet 11   No current facility-administered medications for this visit.    PAST MEDICAL HISTORY: Past Medical History  Diagnosis Date  . Allergy   . Depression     PAST SURGICAL HISTORY: Past Surgical History  Procedure Laterality Date  . Cesarean section    . Tonsillectomy    . Admission  09/24/2003    diverticulitis.      FAMILY HISTORY: Family History  Problem Relation Age of Onset  . Hypertension Brother   . Colon cancer Neg Hx   . Esophageal cancer Neg Hx   . Stomach cancer Neg Hx   . Rectal cancer Neg Hx   . Throat cancer Father     SOCIAL HISTORY:  Social History   Social History  . Marital Status: Divorced    Spouse Name: N/A  . Number of Children: 0  . Years of Education: BA    Occupational History  . N/A    Social History Main Topics  . Smoking status: Never Smoker   . Smokeless tobacco: Never Used  . Alcohol Use: 0.0 oz/week  0 Standard drinks or equivalent per week     Comment: Maybe once every 6 months  . Drug Use: No  . Sexual Activity: No   Other Topics Concern  . Not on file   Social History Narrative   Marital status: single/widowed.      Children: none      Lives: alone      Employment: unemployed;       Tobacco: none      Alcohol: rare      Exercise:  Walking; going to pool.   Drinks diet pepsi daily and recently will drink coffee every so often     PHYSICAL EXAM   Filed Vitals:   10/16/15 1327  BP: 135/83  Pulse: 78  Resp: 16  Height:  (1.626 m)  Weight: 182 lb (82.555 kg)    Not recorded      Body mass index is 31.22  kg/(m^2).  PHYSICAL EXAMNIATION:  Gen: NAD, conversant, well nourised, obese, well groomed                     Cardiovascular: Regular rate rhythm, no peripheral edema, warm, nontender. Eyes: Conjunctivae clear without exudates or hemorrhage Neck: Supple, no carotid bruise. Pulmonary: Clear to auscultation bilaterally   NEUROLOGICAL EXAM:  MENTAL STATUS: Speech:    Speech is normal; fluent and spontaneous with normal comprehension.  Cognition:     Orientation to time, place and person     Normal recent and remote memory     Normal Attention span and concentration     Normal Language, naming, repeating,spontaneous speech     Fund of knowledge   CRANIAL NERVES: CN II: Visual fields are full to confrontation. Fundoscopic exam is normal with sharp discs and no vascular changes. Pupils are round equal and briskly reactive to light. CN III, IV, VI: extraocular movement are normal. No ptosis. CN V: Facial sensation is intact to pinprick in all 3 divisions bilaterally. Corneal responses are intact.  CN VII: Face is symmetric with normal eye closure and smile. CN VIII: Hearing is normal to rubbing fingers CN IX, X: Palate elevates symmetrically. Phonation is normal. CN XI: Head turning and shoulder shrug are intact CN XII: Tongue is midline with normal movements and no atrophy.  MOTOR: There is no pronator drift of out-stretched arms. Muscle bulk and tone are normal. Muscle strength is normal.  REFLEXES: Reflexes are 2+ and symmetric at the biceps, triceps, knees, and ankles. Plantar responses are flexor.  SENSORY: Intact to light touch, pinprick, position sense, and vibration sense are intact in fingers and toes.  COORDINATION: Rapid alternating movements and fine finger movements are intact. There is no dysmetria on finger-to-nose and heel-knee-shin.    GAIT/STANCE: Stiff, cautious gait  DIAGNOSTIC DATA (LABS, IMAGING, TESTING) - I reviewed patient records, labs, notes,  testing and imaging myself where available.   ASSESSMENT AND PLAN  Cathy Gallegos is a 65 y.o. female    Urinary incontinence, mildly unsteady gait  Hyperreflexia on examinations  Need to rule out cervical myelopathy, proceed with MRI cervical spine   Levert Feinstein, M.D. Ph.D.  [facility] [address], [address] [place], [place] [address] Ph: [phone] Fax: [phone]  CC:  Neurosurgeon Dr. Julio Sicks, primary care physician Dr. Elvina Sidle

## 2015-10-24 ENCOUNTER — Ambulatory Visit
Admission: RE | Admit: 2015-10-24 | Discharge: 2015-10-24 | Disposition: A | Discharge status: Home / Self Care | Payer: PPO | Source: Ambulatory Visit | Attending: Neurology | Admitting: Neurology

## 2015-10-24 DIAGNOSIS — R269 Unspecified abnormalities of gait and mobility: Secondary | ICD-10-CM

## 2015-10-24 DIAGNOSIS — N39498 Other specified urinary incontinence: Secondary | ICD-10-CM

## 2015-10-25 ENCOUNTER — Telehealth: Payer: Self-pay | Admitting: Neurology

## 2015-10-25 NOTE — Telephone Encounter (Signed)
Spoke to patient she is aware of results

## 2015-10-25 NOTE — Telephone Encounter (Signed)
Called patient at the only number provided - no answer - unable to leave message (mailbox full).  She has a pending appt on 11/09/15.

## 2015-10-25 NOTE — Telephone Encounter (Signed)
Please call patient, MRI of the cervical spine showed multilevel degenerative changes, but no evidence of spinal cord compression, I will go over detail at her next follow-up visit  Abnormal MRI cervical spine (without) demonstrating: 1. At C6-7: uncovertebral joint hypertrophy and facet hypertrophy with moderate right foraminal stenosis. 2. At C3-4: uncovertebral joint hypertrophy and facet hypertrophy with mild left foraminal stenosis. 3. No intrinsic or compressive spinal cord lesions.

## 2015-11-09 ENCOUNTER — Ambulatory Visit (INDEPENDENT_AMBULATORY_CARE_PROVIDER_SITE_OTHER): Payer: PPO | Admitting: Neurology

## 2015-11-09 ENCOUNTER — Encounter: Payer: Self-pay | Admitting: Neurology

## 2015-11-09 VITALS — BP 157/89 | HR 82 | Ht 64.0 in | Wt 184.0 lb

## 2015-11-09 DIAGNOSIS — H02409 Unspecified ptosis of unspecified eyelid: Secondary | ICD-10-CM

## 2015-11-09 DIAGNOSIS — H02403 Unspecified ptosis of bilateral eyelids: Secondary | ICD-10-CM

## 2015-11-09 DIAGNOSIS — R269 Unspecified abnormalities of gait and mobility: Secondary | ICD-10-CM

## 2015-11-09 DIAGNOSIS — N39498 Other specified urinary incontinence: Secondary | ICD-10-CM

## 2015-11-09 HISTORY — DX: Unspecified ptosis of unspecified eyelid: H02.409

## 2015-11-09 NOTE — Progress Notes (Signed)
Chief Complaint  Patient presents with  . Abnormality of Gait    She is here to discuss her MRI results.      PATIENT: Cathy Gallegos DOB: 1950-11-08  Chief Complaint  Patient presents with  . Abnormality of Gait    She is here to discuss her MRI results.     HISTORICAL  Cathy Gallegos is a 65 years old right-handed female, alone at visit, seen in refer by  Neurosurgeon Dr. Julio Sicks, primary care physician Dr. Elvina Sidle for evaluation of constellation of complaints, this includes urinary incontinence, fatigue, right arm, leg pain, low back pain, history of falling  She has chronic low back pain for many years, it is difficult for her to sit on hard surface, since she fell in September 2016, she began to notice increased to right side low back pain, radiating pain to right leg, she also noticed mild unsteady gait, she lives alone, often felt lonely, around November 2016, she was startled to find herself woke up almost every night has wet herself, she denies bowel incontinence, she does have progressive worsening daytime urinary urgency, occasionally accident.  Overall her nightly urinary accident has improved since January 2017  She was seen by her PCP, who ordered MRI, I have personally reviewed MRI of lumbar in December 2016: Small disc protrusion in and lateral to the right neural foramen at L3-4 touching the right L3 nerve. Small disc bulge lateral to the left neural foramen at L2-3 touching the left L2 nerve. Bilateral moderate facet arthritis at L4-5.  She  denies significant neck pain, no bilateral upper extremity paresthesia or weakness   Update November 09 2015:  She continued to complains of depression, overall her urinary incontinence has much improved, she still has generalized weakness, unsteady gait,  We have personally reviewed MRI of cervical spine, multilevel cervical degenerative changes, no significant canal stenosis, mild multilevel foraminal stenosis, no  evidence of root compression  REVIEW OF SYSTEMS: Full 14 system review of systems performed and notable only for fatigue, lack of energy, difficulty swallowing  ALLERGIES: Allergies  Allergen Reactions  . Dust Mite Mixed Allergen Ext [Mite (D. Farinae)]   . Latex Swelling  . Molds & Smuts     HOME MEDICATIONS: Current Outpatient Prescriptions  Medication Sig Dispense Refill  . cetirizine (ZYRTEC) 10 MG tablet Take 1 tablet (10 mg total) by mouth daily. 60 tablet 5  . fluticasone (FLONASE) 50 MCG/ACT nasal spray USE 2 SPRAYS IN BOTH NOSTRILS DAILY 16 g 0  . meloxicam (MOBIC) 15 MG tablet   3  . PARoxetine (PAXIL) 40 MG tablet Take one daily for depression 30 tablet 11   No current facility-administered medications for this visit.    PAST MEDICAL HISTORY: Past Medical History  Diagnosis Date  . Allergy   . Depression     PAST SURGICAL HISTORY: Past Surgical History  Procedure Laterality Date  . Cesarean section    . Tonsillectomy    . Admission  09/24/2003    diverticulitis.      FAMILY HISTORY: Family History  Problem Relation Age of Onset  . Hypertension Brother   . Colon cancer Neg Hx   . Esophageal cancer Neg Hx   . Stomach cancer Neg Hx   . Rectal cancer Neg Hx   . Throat cancer Father     SOCIAL HISTORY:  Social History   Social History  . Marital Status: Divorced    Spouse Name: N/A  . Number  of Children: 0  . Years of Education: BA    Occupational History  . N/A    Social History Main Topics  . Smoking status: Never Smoker   . Smokeless tobacco: Never Used  . Alcohol Use: 0.0 oz/week    0 Standard drinks or equivalent per week     Comment: Maybe once every 6 months  . Drug Use: No  . Sexual Activity: No   Other Topics Concern  . Not on file   Social History Narrative   Marital status: single/widowed.      Children: none      Lives: alone      Employment: unemployed;       Tobacco: none      Alcohol: rare      Exercise:  Walking;  going to pool.   Drinks diet pepsi daily and recently will drink coffee every so often     PHYSICAL EXAM   Filed Vitals:   11/09/15 1325  BP: 157/89  Pulse: 82  Height: 5\' 4"  (1.626 m)  Weight: 184 lb (83.462 kg)    Not recorded      Body mass index is 31.57 kg/(m^2).  PHYSICAL EXAMNIATION:  Gen: NAD, conversant, well nourised, obese, well groomed                     Cardiovascular: Regular rate rhythm, no peripheral edema, warm, nontender. Eyes: Conjunctivae clear without exudates or hemorrhage Neck: Supple, no carotid bruise. Pulmonary: Clear to auscultation bilaterally   NEUROLOGICAL EXAM:  MENTAL STATUS: Speech:    Speech is normal; fluent and spontaneous with normal comprehension.  Cognition:     Orientation to time, place and person     Normal recent and remote memory     Normal Attention span and concentration     Normal Language, naming, repeating,spontaneous speech     Fund of knowledge   CRANIAL NERVES: CN II: Visual fields are full to confrontation. Fundoscopic exam is normal with sharp discs and no vascular changes. Pupils are round equal and briskly reactive to light. CN III, IV, VI: extraocular movement are normal. She has static bilateral ptosis, mild eye-closure weakness, CN V: Facial sensation is intact to pinprick in all 3 divisions bilaterally. Corneal responses are intact.  CN VII: Face is symmetric with normal eye closure and smile. CN VIII: Hearing is normal to rubbing fingers CN IX, X: Palate elevates symmetrically. Phonation is normal. CN XI: Head turning and shoulder shrug are intact CN XII: Tongue is midline with normal movements and no atrophy.  MOTOR: There is no pronator drift of out-stretched arms. Muscle bulk and tone are normal. Muscle strength is normal. With exception of mild neck flexion weakness  REFLEXES: Reflexes are 3 and symmetric at the biceps, triceps, knees, and ankles. Plantar responses are flexor.  SENSORY: Intact  to light touch, pinprick, position sense, and vibration sense are intact in fingers and toes.  COORDINATION: Rapid alternating movements and fine finger movements are intact. There is no dysmetria on finger-to-nose and heel-knee-shin.    GAIT/STANCE: Stiff, cautious gait  DIAGNOSTIC DATA (LABS, IMAGING, TESTING) - I reviewed patient records, labs, notes, testing and imaging myself where available.   ASSESSMENT AND PLAN  Cathy Gallegos is a 65 y.o. female    Urinary incontinence, mildly unsteady gait  Hyperreflexia on examinations  Differentiation diagnosis includes periventricular white matter disease,  MRI of cervical spine fail to demonstrate cervical myelopathy,  Further evaluation, proceed with MRI  of brain,   Bilateral ptosis, mild eye-closure neck flexion weakness  Laboratory evaluations, includes TSH, acetylcholine receptor antibody  Levert Feinstein, M.D. Ph.D.  [facility] [address], [address] [place], [place] [address] Ph: [phone] Fax: [phone]  CC:  Neurosurgeon Dr. Julio Sicks, primary care physician Dr. Elvina Sidle

## 2015-11-10 LAB — COMPREHENSIVE METABOLIC PANEL WITH GFR
ALT: 10 IU/L (ref 0–32)
AST: 14 IU/L (ref 0–40)
Albumin/Globulin Ratio: 1.9 (ref 1.1–2.5)
Albumin: 4.1 g/dL (ref 3.6–4.8)
Alkaline Phosphatase: 91 IU/L (ref 39–117)
BUN/Creatinine Ratio: 20 (ref 11–26)
BUN: 15 mg/dL (ref 8–27)
Bilirubin Total: <0.2 mg/dL (ref 0.0–1.2)
CO2: 22 mmol/L (ref 18–29)
Calcium: 9.3 mg/dL (ref 8.7–10.3)
Chloride: 100 mmol/L (ref 96–106)
Creatinine, Ser: 0.75 mg/dL (ref 0.57–1.00)
GFR calc Af Amer: 97 mL/min/1.73
GFR calc non Af Amer: 84 mL/min/1.73
Globulin, Total: 2.2 g/dL (ref 1.5–4.5)
Glucose: 110 mg/dL — ABNORMAL HIGH (ref 65–99)
Potassium: 4.4 mmol/L (ref 3.5–5.2)
Sodium: 141 mmol/L (ref 134–144)
Total Protein: 6.3 g/dL (ref 6.0–8.5)

## 2015-11-10 LAB — CBC
Hematocrit: 40.4 % (ref 34.0–46.6)
Hemoglobin: 13 g/dL (ref 11.1–15.9)
MCH: 26.7 pg (ref 26.6–33.0)
MCHC: 32.2 g/dL (ref 31.5–35.7)
MCV: 83 fL (ref 79–97)
Platelets: 275 x10E3/uL (ref 150–379)
RBC: 4.87 x10E6/uL (ref 3.77–5.28)
RDW: 15 % (ref 12.3–15.4)
WBC: 10 x10E3/uL (ref 3.4–10.8)

## 2015-11-10 LAB — SEDIMENTATION RATE: Sed Rate: 5 mm/h (ref 0–40)

## 2015-11-10 LAB — VITAMIN B12: Vitamin B-12: 428 pg/mL (ref 211–946)

## 2015-11-10 LAB — CK: Total CK: 57 U/L (ref 24–173)

## 2015-11-10 LAB — ACETYLCHOLINE RECEPTOR, BINDING

## 2015-11-10 LAB — C-REACTIVE PROTEIN: CRP: 4.3 mg/L (ref 0.0–4.9)

## 2015-11-10 LAB — TSH: TSH: 4.88 u[IU]/mL — AB (ref 0.450–4.500)

## 2015-11-13 ENCOUNTER — Telehealth: Payer: Self-pay | Admitting: Neurology

## 2015-11-13 NOTE — Telephone Encounter (Signed)
Please call patient, laboratory evaluation showed elevated TSH, indicating hypothyroid function, I have faxed the results to her primary care Elvina Sidle, MD She may consider contacted Dr. Milus Glazier for potential repeating test, thyroid supplement, rest of the laboratory was normal

## 2015-11-13 NOTE — Telephone Encounter (Signed)
Patient aware of results and will follow up with her PCP.

## 2015-11-17 ENCOUNTER — Ambulatory Visit (INDEPENDENT_AMBULATORY_CARE_PROVIDER_SITE_OTHER): Payer: PPO | Admitting: Physician Assistant

## 2015-11-17 VITALS — BP 116/76 | HR 65 | Temp 98.2°F | Resp 18 | Wt 176.4 lb

## 2015-11-17 DIAGNOSIS — R946 Abnormal results of thyroid function studies: Secondary | ICD-10-CM | POA: Diagnosis not present

## 2015-11-17 DIAGNOSIS — R7989 Other specified abnormal findings of blood chemistry: Secondary | ICD-10-CM

## 2015-11-17 LAB — T4, FREE: FREE T4: 1 ng/dL (ref 0.8–1.8)

## 2015-11-17 LAB — TSH: TSH: 5.27 m[IU]/L — AB

## 2015-11-17 NOTE — Progress Notes (Signed)
   Cathy Gallegos  MRN: [id number] DOB: 08/11/1951  Subjective:  Pt presents to clinic to have her thyroid checked.  She has been seeing neurologist who did lab work on her and she was told she needed to have further evaluation of her thyroid due to her lab results being slightly high.  Patient Active Problem List   Diagnosis Date Noted  . Ptosis 11/09/2015  . Abnormality of gait 10/16/2015  . Urinary incontinence 10/16/2015    Current Outpatient Prescriptions on File Prior to Visit  Medication Sig Dispense Refill  . cetirizine (ZYRTEC) 10 MG tablet Take 1 tablet (10 mg total) by mouth daily. 60 tablet 5  . fluticasone (FLONASE) 50 MCG/ACT nasal spray USE 2 SPRAYS IN BOTH NOSTRILS DAILY 16 g 0  . meloxicam (MOBIC) 15 MG tablet   3  . PARoxetine (PAXIL) 40 MG tablet Take one daily for depression 30 tablet 11   No current facility-administered medications on file prior to visit.    Allergies  Allergen Reactions  . Dust Mite Mixed Allergen Ext [Mite (D. Farinae)]   . Latex Swelling  . Molds & Smuts     Review of Systems Objective:  BP 116/76 mmHg  Pulse 65  Temp(Src) 98.2 F (36.8 C) (Oral)  Resp 18  Wt 176 lb 6.4 oz (80.015 kg)  SpO2 97%  Physical Exam  Constitutional: She is oriented to person, place, and time and well-developed, well-nourished, and in no distress.  HENT:  Head: Normocephalic and atraumatic.  Right Ear: Hearing and external ear normal.  Left Ear: Hearing and external ear normal.  Eyes: Conjunctivae are normal.  Neck: Normal range of motion. No thyroid mass and no thyromegaly present.  Pulmonary/Chest: Effort normal.  Neurological: She is alert and oriented to person, place, and time. Gait normal.  Skin: Skin is warm and dry.  Psychiatric: Mood, memory, affect and judgment normal.  Vitals reviewed.   Assessment and Plan :  Elevated TSH - Plan: TSH, T4, Free, Care order/instruction   Check labs and determine next step with those results.  Benny Lennert PA-C  [facility] 11/17/2015 5:24 PM

## 2015-11-17 NOTE — Patient Instructions (Signed)
I will contact you with your lab results as soon as they are available.   If you have not heard from me in 2 weeks, please contact me.  The fastest way to get your results is to register for My Chart (see the instructions on the last page of this printout).   

## 2015-11-19 ENCOUNTER — Ambulatory Visit
Admission: RE | Admit: 2015-11-19 | Discharge: 2015-11-19 | Disposition: A | Discharge status: Home / Self Care | Payer: PPO | Source: Ambulatory Visit | Attending: Neurology | Admitting: Neurology

## 2015-11-19 DIAGNOSIS — H02403 Unspecified ptosis of bilateral eyelids: Secondary | ICD-10-CM

## 2015-11-19 DIAGNOSIS — N39498 Other specified urinary incontinence: Secondary | ICD-10-CM

## 2015-11-19 DIAGNOSIS — R269 Unspecified abnormalities of gait and mobility: Secondary | ICD-10-CM

## 2015-11-20 ENCOUNTER — Telehealth: Payer: Self-pay | Admitting: Neurology

## 2015-11-20 NOTE — Telephone Encounter (Signed)
I spoke to patient and she is aware of results and voiced understanding.  

## 2015-11-20 NOTE — Telephone Encounter (Signed)
Elon Jester, please call patient, mild age related changes, no acute findings, I will review detail at her next follow-up visit in April  IMPRESSION: This MRI of the brain without contrast shows the following: 1. Mild overall cortical atrophy that is moderate in the parietal lobes. 2. Few scattered T2/FLAIR hyperintense foci consistent with minimal chronic microvascular ischemic change, typical for age. 3. There are no acute findings

## 2015-12-14 DIAGNOSIS — F330 Major depressive disorder, recurrent, mild: Secondary | ICD-10-CM | POA: Diagnosis not present

## 2015-12-14 DIAGNOSIS — Z0000 Encounter for general adult medical examination without abnormal findings: Secondary | ICD-10-CM | POA: Diagnosis not present

## 2015-12-14 DIAGNOSIS — Z6829 Body mass index (BMI) 29.0-29.9, adult: Secondary | ICD-10-CM | POA: Diagnosis not present

## 2015-12-29 ENCOUNTER — Telehealth: Payer: Self-pay | Admitting: Neurology

## 2015-12-29 NOTE — Telephone Encounter (Signed)
Patient called, has appointment 01/09/16 with Dr. Terrace ArabiaYan, saw something on television this morning regarding Myasthenia Gravis and thinks this pertains to her. Patient is aware Dr. Terrace ArabiaYan is not in the office today and will be out of the office next week. May call after Dr. Terrace ArabiaYan is back in the office.

## 2016-01-01 NOTE — Telephone Encounter (Signed)
Called patient - Cathy Gallegos is coming in to see Dr. Terrace ArabiaYan on 01/09/16 and will further address her concerns at this appointment.

## 2016-01-09 ENCOUNTER — Ambulatory Visit (INDEPENDENT_AMBULATORY_CARE_PROVIDER_SITE_OTHER): Payer: PPO | Admitting: Neurology

## 2016-01-09 ENCOUNTER — Encounter: Payer: Self-pay | Admitting: Neurology

## 2016-01-09 VITALS — BP 159/82 | HR 71 | Ht 64.0 in | Wt 189.0 lb

## 2016-01-09 DIAGNOSIS — R269 Unspecified abnormalities of gait and mobility: Secondary | ICD-10-CM | POA: Diagnosis not present

## 2016-01-09 DIAGNOSIS — N39498 Other specified urinary incontinence: Secondary | ICD-10-CM | POA: Diagnosis not present

## 2016-01-09 DIAGNOSIS — H02403 Unspecified ptosis of bilateral eyelids: Secondary | ICD-10-CM | POA: Diagnosis not present

## 2016-01-09 NOTE — Patient Instructions (Signed)
[email or web address]  Facial Plastic and Reconstructive Surgery Office [phone] Gift Certificates Available  [phone] Request an Appointment with Dr. Jeral Pinchowns Request an Appointment with Dr. Jeannine BogaWallin

## 2016-01-09 NOTE — Progress Notes (Signed)
Chief Complaint  Patient presents with  . Ptosis    States her eyelids have drooped since she was a child. However, her left eyelid has become worse and she is now having to physically lift it when she wakes up each morning.  She would like to discuss her MRI brain in greater detail.  . Gait Problem    Feels her gait has improved.   Chief Complaint  Patient presents with  . Ptosis    States her eyelids have drooped since she was a child. However, her left eyelid has become worse and she is now having to physically lift it when she wakes up each morning.  She would like to discuss her MRI brain in greater detail.  . Gait Problem    Feels her gait has improved.      PATIENT: Cathy Gallegos DOB: Jan 03, 1951  Chief Complaint  Patient presents with  . Ptosis    States her eyelids have drooped since she was a child. However, her left eyelid has become worse and she is now having to physically lift it when she wakes up each morning.  She would like to discuss her MRI brain in greater detail.  . Gait Problem    Feels her gait has improved.     HISTORICAL  Cathy Gallegos is a 65 years old right-handed female, alone at visit, seen in refer by  Neurosurgeon Dr. Earnie Larsson, primary care physician Dr. Robyn Haber for evaluation of constellation of complaints, this includes urinary incontinence, fatigue, right arm, leg pain, low back pain, history of falling  She has chronic low back pain for many years, it is difficult for her to sit on hard surface, since she fell in September 2016, she began to notice increased to right side low back pain, radiating pain to right leg, she also noticed mild unsteady gait, she lives alone, often felt lonely, around November 2016, she was startled to find herself woke up almost every night has wet herself, she denies bowel incontinence, she does have progressive worsening daytime urinary urgency, occasionally accident.  Overall her nightly urinary accident has  improved since January 2017  She was seen by her PCP, who ordered MRI, I have personally reviewed MRI of lumbar in December 2016: Small disc protrusion in and lateral to the right neural foramen at L3-4 touching the right L3 nerve. Small disc bulge lateral to the left neural foramen at L2-3 touching the left L2 nerve. Bilateral moderate facet arthritis at L4-5.  She  denies significant neck pain, no bilateral upper extremity paresthesia or weakness   Update November 09 2015:  She continued to complains of depression, overall her urinary incontinence has much improved, she still has generalized weakness, unsteady gait,  We have personally reviewed MRI of cervical spine, multilevel cervical degenerative changes, no significant canal stenosis, mild multilevel foraminal stenosis, no evidence of root compression  UPDATE January 09 2016: Reviewed laboratory evaluation in February 2017, elevated TSH, she was evaluated by her primary care physician Dr. Joseph Art, per patient, there was no significant abnormality on the repeat testing, rest of the laboratory evaluation showed  normal B12, C-reactive protein, negative ANA, CPK, ESR, acetylcholine receptor antibody.  She continue concerned about her bilateral ptosis, left worse than right, per patient, it has been present since childhood, but is gradually getting worse, it is to the point of blocking her vision, especially on the left side, she denies double vision, she no longer has nocturnal urinary incontinence, does  has urinary urgency, no significant gait difficulty.  We have personally reviewed MRI of the brain in February 2017, moderate cortical atrophy especially at bilateral parietal, mild supratentorium small vessel disease  REVIEW OF SYSTEMS: Full 14 system review of systems performed and notable only for right hip pain, eye discharge, fatigue, joint pain, low back pain, depression anxiety  ALLERGIES: Allergies  Allergen Reactions  . Dust Mite  Mixed Allergen Ext [Mite (D. Farinae)]   . Latex Swelling  . Molds & Smuts     HOME MEDICATIONS: Current Outpatient Prescriptions  Medication Sig Dispense Refill  . cetirizine (ZYRTEC) 10 MG tablet Take 1 tablet (10 mg total) by mouth daily. 60 tablet 5  . fluticasone (FLONASE) 50 MCG/ACT nasal spray USE 2 SPRAYS IN BOTH NOSTRILS DAILY 16 g 0  . meloxicam (MOBIC) 15 MG tablet   3  . PARoxetine (PAXIL) 40 MG tablet Take one daily for depression 30 tablet 11   No current facility-administered medications for this visit.    PAST MEDICAL HISTORY: Past Medical History  Diagnosis Date  . Allergy   . Depression     PAST SURGICAL HISTORY: Past Surgical History  Procedure Laterality Date  . Cesarean section    . Tonsillectomy    . Admission  09/24/2003    diverticulitis.      FAMILY HISTORY: Family History  Problem Relation Age of Onset  . Hypertension Brother   . Colon cancer Neg Hx   . Esophageal cancer Neg Hx   . Stomach cancer Neg Hx   . Rectal cancer Neg Hx   . Throat cancer Father     SOCIAL HISTORY:  Social History   Social History  . Marital Status: Divorced    Spouse Name: N/A  . Number of Children: 0  . Years of Education: BA    Occupational History  . N/A    Social History Main Topics  . Smoking status: Never Smoker   . Smokeless tobacco: Never Used  . Alcohol Use: 0.0 oz/week    0 Standard drinks or equivalent per week     Comment: Maybe once every 6 months  . Drug Use: No  . Sexual Activity: No   Other Topics Concern  . Not on file   Social History Narrative   Marital status: single/widowed.      Children: none      Lives: alone      Employment: unemployed;       Tobacco: none      Alcohol: rare      Exercise:  Walking; going to pool.   Drinks diet pepsi daily and recently will drink coffee every so often     PHYSICAL EXAM   Filed Vitals:   01/09/16 0837  BP: 159/82  Pulse: 71  Height: '5\' 4"'  (1.626 m)  Weight: 189 lb (85.73 kg)      Not recorded      Body mass index is 32.43 kg/(m^2).  PHYSICAL EXAMNIATION:  Gen: NAD, conversant, well nourised, obese, well groomed                     Cardiovascular: Regular rate rhythm, no peripheral edema, warm, nontender. Eyes: Conjunctivae clear without exudates or hemorrhage Neck: Supple, no carotid bruise. Pulmonary: Clear to auscultation bilaterally   NEUROLOGICAL EXAM:  MENTAL STATUS: Speech:    Speech is normal; fluent and spontaneous with normal comprehension.  Cognition:     Orientation to time, place and person  Normal recent and remote memory     Normal Attention span and concentration     Normal Language, naming, repeating,spontaneous speech     Fund of knowledge   CRANIAL NERVES: CN II: Visual fields are full to confrontation. Fundoscopic exam is normal with sharp discs and no vascular changes. Pupils are round equal and briskly reactive to light. CN III, IV, VI: extraocular movement are normal. She has static bilateral ptosis,left worse than right, forceful bilateral frontalis contraction to keep her eyes open, she has no significant eye-closure weakness, CN V: Facial sensation is intact to pinprick in all 3 divisions bilaterally. Corneal responses are intact.  CN VII: Face is symmetric with normal eye closure and smile. CN VIII: Hearing is normal to rubbing fingers CN IX, X: Palate elevates symmetrically. Phonation is normal. CN XI: Head turning and shoulder shrug are intact CN XII: Tongue is midline with normal movements and no atrophy.  MOTOR: There is no pronator drift of out-stretched arms. Muscle bulk and tone are normal. Muscle strength is normal.   REFLEXES: Reflexes are 3 and symmetric at the biceps, triceps, knees, and ankles. Plantar responses are flexor.  SENSORY: Intact to light touch, pinprick, position sense, and vibration sense are intact in fingers and toes.  COORDINATION: Rapid alternating movements and fine finger movements  are intact. There is no dysmetria on finger-to-nose and heel-knee-shin.    GAIT/STANCE: Mild antalgic gait due to right hip pain, steady,  DIAGNOSTIC DATA (LABS, IMAGING, TESTING) - I reviewed patient records, labs, notes, testing and imaging myself where available.   ASSESSMENT AND PLAN  DUSTI TETRO is a 65 y.o. female    Urinary incontinence, mildly unsteady gait  Hyperreflexia on examinations  Differentiation diagnosis includes periventricular white matter disease,  MRI of cervical spine fail to demonstrate cervical myelopathy,  MRI of the brain showed  moderate bilateral parietal lobes atrophy   Bilateral ptosis,  There is no evidence of neuromuscular junctional disorder, acetylcholine receptor antibody was negative  Her progressive worsening bilateral ptosis  is to the point of blocking her vision, she has constant overcompensated bilateral frontalis muscle contraction to keep her eyes open, I will refer her to [facility] for blepharoplasty.   Marcial Pacas, M.D. Ph.D.  [facility] [address] [place], [place] [address] Ph: [phone] Fax: [phone]  CC:  Neurosurgeon Dr. Earnie Larsson, primary care physician Dr. Robyn Haber

## 2016-01-12 DIAGNOSIS — H02403 Unspecified ptosis of bilateral eyelids: Secondary | ICD-10-CM | POA: Diagnosis not present

## 2016-01-30 ENCOUNTER — Encounter: Payer: Self-pay | Admitting: Neurology

## 2016-02-01 DIAGNOSIS — Q100 Congenital ptosis: Secondary | ICD-10-CM | POA: Diagnosis not present

## 2016-02-01 DIAGNOSIS — H02409 Unspecified ptosis of unspecified eyelid: Secondary | ICD-10-CM | POA: Diagnosis not present

## 2016-02-01 DIAGNOSIS — Z7722 Contact with and (suspected) exposure to environmental tobacco smoke (acute) (chronic): Secondary | ICD-10-CM | POA: Diagnosis not present

## 2016-02-01 DIAGNOSIS — H02403 Unspecified ptosis of bilateral eyelids: Secondary | ICD-10-CM | POA: Diagnosis not present

## 2016-02-01 DIAGNOSIS — H2513 Age-related nuclear cataract, bilateral: Secondary | ICD-10-CM | POA: Diagnosis not present

## 2016-02-01 DIAGNOSIS — H0289 Other specified disorders of eyelid: Secondary | ICD-10-CM | POA: Diagnosis not present

## 2016-03-13 ENCOUNTER — Other Ambulatory Visit: Payer: Self-pay | Admitting: Family Medicine

## 2016-05-11 ENCOUNTER — Other Ambulatory Visit: Payer: Self-pay | Admitting: Family Medicine

## 2016-05-14 ENCOUNTER — Telehealth: Payer: Self-pay | Admitting: Family Medicine

## 2016-05-14 NOTE — Telephone Encounter (Signed)
Relation to ON:GEXBpt:self Call back [phone] [id number] [place] - [id number] [facility] [address] [phone] (Phone) [phone])      Reason for call:  Patient requesting a refill cetirizine (ZYRTEC) 10 MG tablet and PARoxetine (PAXIL) 40 MG tablet.

## 2016-05-15 ENCOUNTER — Ambulatory Visit (INDEPENDENT_AMBULATORY_CARE_PROVIDER_SITE_OTHER): Payer: PPO | Admitting: Family Medicine

## 2016-05-15 ENCOUNTER — Encounter: Payer: Self-pay | Admitting: Emergency Medicine

## 2016-05-15 ENCOUNTER — Other Ambulatory Visit: Payer: Self-pay | Admitting: Emergency Medicine

## 2016-05-15 VITALS — BP 124/79 | HR 85 | Temp 97.8°F | Ht 64.0 in | Wt 178.6 lb

## 2016-05-15 DIAGNOSIS — F329 Major depressive disorder, single episode, unspecified: Secondary | ICD-10-CM

## 2016-05-15 DIAGNOSIS — M5441 Lumbago with sciatica, right side: Secondary | ICD-10-CM

## 2016-05-15 DIAGNOSIS — D72829 Elevated white blood cell count, unspecified: Secondary | ICD-10-CM

## 2016-05-15 DIAGNOSIS — J302 Other seasonal allergic rhinitis: Secondary | ICD-10-CM

## 2016-05-15 DIAGNOSIS — N3944 Nocturnal enuresis: Secondary | ICD-10-CM | POA: Diagnosis not present

## 2016-05-15 DIAGNOSIS — F32A Depression, unspecified: Secondary | ICD-10-CM

## 2016-05-15 DIAGNOSIS — B351 Tinea unguium: Secondary | ICD-10-CM

## 2016-05-15 DIAGNOSIS — R7309 Other abnormal glucose: Secondary | ICD-10-CM

## 2016-05-15 DIAGNOSIS — Z5181 Encounter for therapeutic drug level monitoring: Secondary | ICD-10-CM

## 2016-05-15 MED ORDER — FLUTICASONE PROPIONATE 50 MCG/ACT NA SUSP: NASAL | 9 refills | Status: DC

## 2016-05-15 MED ORDER — CETIRIZINE HCL 10 MG PO TABS: 10.0000 mg | ORAL_TABLET | Freq: Every day | ORAL | 3 refills | Status: DC

## 2016-05-15 MED ORDER — TERBINAFINE HCL 250 MG PO TABS: 250.0000 mg | ORAL_TABLET | Freq: Every day | ORAL | 0 refills | Status: DC

## 2016-05-15 MED ORDER — GABAPENTIN 300 MG PO CAPS: 300.0000 mg | ORAL_CAPSULE | Freq: Three times a day (TID) | ORAL | 3 refills | Status: DC

## 2016-05-15 MED ORDER — PAROXETINE HCL 40 MG PO TABS: ORAL_TABLET | ORAL | 3 refills | Status: DC

## 2016-05-15 MED ORDER — MELOXICAM 15 MG PO TABS: ORAL_TABLET | ORAL | 3 refills | Status: DC

## 2016-05-15 NOTE — Progress Notes (Signed)
[facility] [address], [address] [place], [place] [address] [phone] [phone]  Date:  05/15/2016   Name:  Cathy CooksSarah E Gallegos   DOB:  1950-11-15   MRN:  [id number]  PCP:  Abbe AmsterdamOPLAND,JESSICA, MD    Chief Complaint: Leg Pain (Right leg pain due to a fall, pain radiates up her side. )   History of Present Illness:  Cathy Gallegos is a 65 y.o. very pleasant female patient who presents with the following:  Last seen by myself in December- partial HPI from that visit  Here today with several concerns- she has noted lower back ache (however she has been lifting a lot recenlty).  She also notes that she is "wetting the bed."  She is actually emptying her bladder completely while asleep    She has noted the lower back pain off an on a couple of months ago, more persistent for a week.  It is worse over the last week.   She notes some pain down her right leg- this is not really new but is worse.   She can tell the right leg is not quite as strong as the other leg but is not aware of any true numbness or specific weakness.   She has noted new onset of betwetting for a month or so- she may wake up or may sleep through.  This has occured nightly over the last 2-4 weeks. During the day she may leak if she needs to urinate and can't make it to the bathroom.  She may have some touble holding her bladder. She also notes that sometimes she will wake up in the am and urinate on herself before she can get to the bathroom. No incont of stool  We sent her for an MRI at that time, and I treated her with prednisone and referred her to [facility] MRI showed (09/08/15)  IMPRESSION: 1. Small disc protrusion in and lateral to the right neural foramen at L3-4 touching the right L3 nerve. 2. Small disc bulge lateral to the left neural foramen at L2-3 touching the left L2 nerve. 3. Bilateral moderate facet arthritis at L4-5.  She did see [facility]G, and they were concerned about MS.   She saw neurology and all was ok- she does NOT have MS as far as we now.   She notes that she continues to urinate during the night often- not just dribbling but full bladder emptying. She may occasionally have leakage or urgency during the day but usually she is dry during waking hours  She also noted eyelid drooping, she was sent [facility].   She is will waiting to get an appt to have blephoroplasty She needs some refills today; allergy med and her paxil.  These meds are working for her, her mood has been ok She will sometimes still have pains in her right leg. She hs noted some bulging veins. She is not sure if she is having nerve pain. The right leg can feel a little weak at times. As above she is known to have degenerative disc disease in her back  She notes a toenail fungus that is bothersome cosmetically.  It is worst in both great toes, present for about 6 months. Not painful   She did fall in the fall of 2016, but no more recent falls  Patient Active Problem List   Diagnosis Date Noted  . Ptosis 11/09/2015  .  Abnormality of gait 10/16/2015  . Urinary incontinence 10/16/2015    Past Medical History:  Diagnosis Date  . Allergy   . Depression     Past Surgical History:  Procedure Laterality Date  . admission  09/24/2003   diverticulitis.    [name]. CESAREAN SECTION    . TONSILLECTOMY      Social History  Substance Use Topics  . Smoking status: Never Smoker  . Smokeless tobacco: Never Used  . Alcohol use 0.0 oz/week     Comment: Maybe once every 6 months    Family History  Problem Relation Age of Onset  . Hypertension Brother   . Throat cancer Father   . Colon cancer Neg Hx   . Esophageal cancer Neg Hx   . Stomach cancer Neg Hx   . Rectal cancer Neg Hx     Allergies  Allergen Reactions  . Dust Mite Mixed Allergen Ext [Mite (D. Farinae)]   . Latex Swelling  . Molds & Smuts     Medication list has been reviewed and  updated.  Current Outpatient Prescriptions on File Prior to Visit  Medication Sig Dispense Refill  . cetirizine (ZYRTEC) 10 MG tablet Take 1 tablet (10 mg total) by mouth daily. 60 tablet 5  . fluticasone (FLONASE) 50 MCG/ACT nasal spray USE 2 SPRAYS IN BOTH NOSTRILS DAILY 16 g 0  . meloxicam (MOBIC) 15 MG tablet   3  . PARoxetine (PAXIL) 40 MG tablet TAKE 1 TABLET BY MOUTH DAILY FOR DEPRESSION 30 tablet 0   No current facility-administered medications on file prior to visit.     Review of Systems:  As per HPI- otherwise negative.   Physical Examination: Vitals:   05/15/16 1611  BP: 124/79  Pulse: 85  Temp: 97.8 F (36.6 C)   Vitals:   05/15/16 1611  Weight: 178 lb 9.6 oz (81 kg)  Height: 5\' 4"  (1.626 m)   Body mass index is 30.66 kg/m. Ideal Body Weight: Weight in (lb) to have BMI = 25: 145.3  GEN: WDWN, NAD, Non-toxic, A & O x 3, overweight, looks well HEENT: Atraumatic, Normocephalic. Neck supple. No masses, No LAD.  Bilateral TM wnl, oropharynx normal.  PEERL,EOMI.   Ears and Nose: No external deformity. CV: RRR, No M/G/R. No JVD. No thrill. No extra heart sounds. PULM: CTA B, no wheezes, crackles, rhonchi. No retractions. No resp. distress. No accessory muscle use. ABD: S, NT, ND, +BS. No rebound. No HSM. EXTR: No c/c/e.  Thickened toenails bilaterally c/w toenail funsu NEURO Normal gait. Normal strength and brisk patellar DTR BLE.  Legs are not tender to touch, no swelling or heat, negative SLR Normal back flexion and extension PSYCH: Normally interactive. Conversant. Not depressed or anxious appearing.  Calm demeanor.    Assessment and Plan: Bed wetting - Plan: Ambulatory referral to Urology  Seasonal allergies - Plan: fluticasone (FLONASE) 50 MCG/ACT nasal spray, cetirizine (ZYRTEC) 10 MG tablet  Right-sided low back pain with right-sided sciatica - Plan: meloxicam (MOBIC) 15 MG tablet, gabapentin (NEURONTIN) 300 MG capsule  Depression - Plan: PARoxetine  (PAXIL) 40 MG tablet  Onychomycosis - Plan: terbinafine (LAMISIL) 250 MG tablet  Medication monitoring encounter - Plan: CBC, Comprehensive metabolic panel  Here today with a few concerns  she is still having bed-wetting.  Discussed a trial of DDAVP vs seeing urology. She would like a referral,I will do for her today Refilled her allergy meds and paxil Suspect she has neuropathic pain down her right leg.  Will try neurontin- she will titrate her dose as tolerated Will plan further follow- up pending labs.   Signed Abbe Amsterdam, MD

## 2016-05-15 NOTE — Patient Instructions (Signed)
It was very nice to see you today!  I will be in touch with your labs asap.  Please keep me posted about your progress and any concerns Bedwetting- referral to urology Leg pain- try gabapentin.  Start with one pill a day. Over 1-2 weeks you can increase to 1 pill 3x a day.  However this medication can make you feel drowsy, especially since you are also taking paxil.  Increase slowly and you can stay with 1-2 pills a day if this is enough for you Continue to use mobic as needed for pain- you do not have to take this medication if not helpful We will use lamisil once daily for 12 weeks for toenail fungus. Remember the nail will not "clear up," but we hope you will grow in a clear new nail.  This can take over 6 months!

## 2016-05-15 NOTE — Progress Notes (Signed)
Pre visit review using our clinic review tool, if applicable. No additional management support is needed unless otherwise documented below in the visit note. 

## 2016-05-16 ENCOUNTER — Encounter: Payer: Self-pay | Admitting: Family Medicine

## 2016-05-16 LAB — CBC
HEMATOCRIT: 37.9 % (ref 36.0–46.0)
HEMOGLOBIN: 12.5 g/dL (ref 12.0–15.0)
MCHC: 33 g/dL (ref 30.0–36.0)
MCV: 81.1 fl (ref 78.0–100.0)
PLATELETS: 251 10*3/uL (ref 150.0–400.0)
RBC: 4.68 Mil/uL (ref 3.87–5.11)
RDW: 15.6 % — AB (ref 11.5–15.5)
WBC: 12 10*3/uL — AB (ref 4.0–10.5)

## 2016-05-16 LAB — COMPREHENSIVE METABOLIC PANEL
ALK PHOS: 89 U/L (ref 39–117)
ALT: 10 U/L (ref 0–35)
AST: 14 U/L (ref 0–37)
Albumin: 4 g/dL (ref 3.5–5.2)
BILIRUBIN TOTAL: 0.2 mg/dL (ref 0.2–1.2)
BUN: 24 mg/dL — ABNORMAL HIGH (ref 6–23)
CALCIUM: 8.6 mg/dL (ref 8.4–10.5)
CO2: 26 mEq/L (ref 19–32)
Chloride: 106 mEq/L (ref 96–112)
Creatinine, Ser: 0.84 mg/dL (ref 0.40–1.20)
GFR: 72.15 mL/min (ref >60.00)
GLUCOSE: 122 mg/dL — AB (ref 70–99)
POTASSIUM: 3.6 meq/L (ref 3.5–5.1)
Sodium: 141 mEq/L (ref 135–145)
TOTAL PROTEIN: 6.5 g/dL (ref 6.0–8.3)

## 2016-05-16 NOTE — Addendum Note (Signed)
Addended by: Abbe AmsterdamOPLAND, Alyssa Mancera C on: 05/16/2016 12:22 PM   Modules accepted: Orders

## 2016-05-21 DIAGNOSIS — N3944 Nocturnal enuresis: Secondary | ICD-10-CM | POA: Diagnosis not present

## 2016-06-03 DIAGNOSIS — R32 Unspecified urinary incontinence: Secondary | ICD-10-CM | POA: Diagnosis not present

## 2016-08-19 DIAGNOSIS — H0234 Blepharochalasis left upper eyelid: Secondary | ICD-10-CM | POA: Diagnosis not present

## 2016-08-19 DIAGNOSIS — H02831 Dermatochalasis of right upper eyelid: Secondary | ICD-10-CM | POA: Diagnosis not present

## 2016-08-19 DIAGNOSIS — H02834 Dermatochalasis of left upper eyelid: Secondary | ICD-10-CM | POA: Diagnosis not present

## 2016-08-19 DIAGNOSIS — J309 Allergic rhinitis, unspecified: Secondary | ICD-10-CM | POA: Diagnosis not present

## 2016-08-19 DIAGNOSIS — Z7722 Contact with and (suspected) exposure to environmental tobacco smoke (acute) (chronic): Secondary | ICD-10-CM | POA: Diagnosis not present

## 2016-08-19 DIAGNOSIS — H0231 Blepharochalasis right upper eyelid: Secondary | ICD-10-CM | POA: Diagnosis not present

## 2016-08-19 DIAGNOSIS — Z79899 Other long term (current) drug therapy: Secondary | ICD-10-CM | POA: Diagnosis not present

## 2016-08-19 DIAGNOSIS — F419 Anxiety disorder, unspecified: Secondary | ICD-10-CM | POA: Diagnosis not present

## 2016-08-19 DIAGNOSIS — Z91040 Latex allergy status: Secondary | ICD-10-CM | POA: Diagnosis not present

## 2016-08-19 DIAGNOSIS — Z886 Allergy status to analgesic agent status: Secondary | ICD-10-CM | POA: Diagnosis not present

## 2016-08-19 DIAGNOSIS — F329 Major depressive disorder, single episode, unspecified: Secondary | ICD-10-CM | POA: Diagnosis not present

## 2016-08-19 DIAGNOSIS — H02403 Unspecified ptosis of bilateral eyelids: Secondary | ICD-10-CM | POA: Diagnosis not present

## 2016-08-19 DIAGNOSIS — H02423 Myogenic ptosis of bilateral eyelids: Secondary | ICD-10-CM | POA: Diagnosis not present

## 2016-08-20 DIAGNOSIS — Z9889 Other specified postprocedural states: Secondary | ICD-10-CM | POA: Diagnosis not present

## 2016-08-20 DIAGNOSIS — Z7722 Contact with and (suspected) exposure to environmental tobacco smoke (acute) (chronic): Secondary | ICD-10-CM | POA: Diagnosis not present

## 2016-08-20 DIAGNOSIS — Z888 Allergy status to other drugs, medicaments and biological substances status: Secondary | ICD-10-CM | POA: Diagnosis not present

## 2016-08-20 DIAGNOSIS — Z48810 Encounter for surgical aftercare following surgery on the sense organs: Secondary | ICD-10-CM | POA: Diagnosis not present

## 2016-08-27 DIAGNOSIS — Z9889 Other specified postprocedural states: Secondary | ICD-10-CM | POA: Diagnosis not present

## 2016-08-27 DIAGNOSIS — Z888 Allergy status to other drugs, medicaments and biological substances status: Secondary | ICD-10-CM | POA: Diagnosis not present

## 2016-08-27 DIAGNOSIS — R233 Spontaneous ecchymoses: Secondary | ICD-10-CM | POA: Diagnosis not present

## 2016-08-27 DIAGNOSIS — Z48810 Encounter for surgical aftercare following surgery on the sense organs: Secondary | ICD-10-CM | POA: Diagnosis not present

## 2016-08-27 DIAGNOSIS — H02203 Unspecified lagophthalmos right eye, unspecified eyelid: Secondary | ICD-10-CM | POA: Diagnosis not present

## 2016-08-27 DIAGNOSIS — Z7722 Contact with and (suspected) exposure to environmental tobacco smoke (acute) (chronic): Secondary | ICD-10-CM | POA: Diagnosis not present

## 2016-08-27 DIAGNOSIS — H02206 Unspecified lagophthalmos left eye, unspecified eyelid: Secondary | ICD-10-CM | POA: Diagnosis not present

## 2016-10-08 DIAGNOSIS — H01025 Squamous blepharitis left lower eyelid: Secondary | ICD-10-CM | POA: Diagnosis not present

## 2016-10-08 DIAGNOSIS — H01022 Squamous blepharitis right lower eyelid: Secondary | ICD-10-CM | POA: Diagnosis not present

## 2016-10-08 DIAGNOSIS — Z48810 Encounter for surgical aftercare following surgery on the sense organs: Secondary | ICD-10-CM | POA: Diagnosis not present

## 2016-10-08 DIAGNOSIS — H01021 Squamous blepharitis right upper eyelid: Secondary | ICD-10-CM | POA: Diagnosis not present

## 2016-10-08 DIAGNOSIS — H01024 Squamous blepharitis left upper eyelid: Secondary | ICD-10-CM | POA: Diagnosis not present

## 2016-10-08 DIAGNOSIS — H02423 Myogenic ptosis of bilateral eyelids: Secondary | ICD-10-CM | POA: Diagnosis not present

## 2016-10-24 ENCOUNTER — Other Ambulatory Visit (INDEPENDENT_AMBULATORY_CARE_PROVIDER_SITE_OTHER): Payer: PPO

## 2016-10-24 DIAGNOSIS — D72829 Elevated white blood cell count, unspecified: Secondary | ICD-10-CM | POA: Diagnosis not present

## 2016-10-24 DIAGNOSIS — R7309 Other abnormal glucose: Secondary | ICD-10-CM

## 2016-10-24 LAB — HEMOGLOBIN A1C: HEMOGLOBIN A1C: 5.6 % (ref 4.6–6.5)

## 2016-10-24 LAB — CBC
HCT: 39.9 % (ref 36.0–46.0)
HEMOGLOBIN: 13.1 g/dL (ref 12.0–15.0)
MCHC: 32.8 g/dL (ref 30.0–36.0)
MCV: 81.9 fl (ref 78.0–100.0)
Platelets: 258 10*3/uL (ref 150.0–400.0)
RBC: 4.87 Mil/uL (ref 3.87–5.11)
RDW: 15.6 % — AB (ref 11.5–15.5)
WBC: 9.4 10*3/uL (ref 4.0–10.5)

## 2016-10-25 ENCOUNTER — Encounter: Payer: Self-pay | Admitting: Family Medicine

## 2016-10-29 ENCOUNTER — Telehealth: Payer: Self-pay | Admitting: Family Medicine

## 2016-10-29 NOTE — Telephone Encounter (Signed)
Patient is requesting her lab results. Please advise

## 2016-10-29 NOTE — Telephone Encounter (Signed)
Patient request that a copy of her labs be mailed to her. States she can be called back if there is anything critical she needs to know.

## 2016-10-30 NOTE — Telephone Encounter (Signed)
Mailed results to pt as requested

## 2016-11-12 DIAGNOSIS — Q100 Congenital ptosis: Secondary | ICD-10-CM | POA: Diagnosis not present

## 2016-11-12 DIAGNOSIS — Z888 Allergy status to other drugs, medicaments and biological substances status: Secondary | ICD-10-CM | POA: Diagnosis not present

## 2016-11-12 DIAGNOSIS — Z48810 Encounter for surgical aftercare following surgery on the sense organs: Secondary | ICD-10-CM | POA: Diagnosis not present

## 2016-11-12 DIAGNOSIS — H01003 Unspecified blepharitis right eye, unspecified eyelid: Secondary | ICD-10-CM | POA: Diagnosis not present

## 2016-11-12 DIAGNOSIS — Z7722 Contact with and (suspected) exposure to environmental tobacco smoke (acute) (chronic): Secondary | ICD-10-CM | POA: Diagnosis not present

## 2016-11-12 DIAGNOSIS — H01006 Unspecified blepharitis left eye, unspecified eyelid: Secondary | ICD-10-CM | POA: Diagnosis not present

## 2016-12-17 ENCOUNTER — Telehealth: Payer: Self-pay | Admitting: Family Medicine

## 2016-12-17 NOTE — Telephone Encounter (Signed)
Attempted calling pt to schedule AWV. VM full.

## 2016-12-24 NOTE — Progress Notes (Signed)
Pre visit review using our clinic review tool, if applicable. No additional management support is needed unless otherwise documented below in the visit note. 

## 2016-12-24 NOTE — Progress Notes (Addendum)
Subjective:   Cathy Gallegos is a 66 y.o. female who presents for an Initial Medicare Annual Wellness Visit.  Review of Systems    No ROS.  Medicare Wellness Visit. Cardiac Risk Factors include: advanced age (>23men, >43 women) Sleep patterns:   Sleeps 10-14 hrs. Feels tired constantly. Home Safety/Smoke Alarms:  Feels safe in home. Smoke alarms in place.  Living environment; residence and Firearm Safety: Lives alone in 2 story home. No guns. Seat Belt Safety/Bike Helmet: Wears seat belt.   Counseling:   Eye Exam- Wears glasses for driving. Encourage pt to schedule eye doctor appt based on vision screen. Dental- dental resource list provided  Female:   Pap- Last about 4 yrs ago per pt.  Scheduled appt with Dr.Copland. Mammo- ORDERED TODAY.      Dexa scan-  ORDERED TODAY. [facility]- Last 06/30/15: diverticulosis.  Repeat in 10 yrs per report.     Objective:    Today's Vitals   12/26/16 1102  BP: 130/76  Pulse: 78  SpO2: 98%  Weight: 169 lb (76.7 kg)  Height:  (1.626 m)  PainSc: 5    Body mass index is 29.01 kg/m.   Current Medications (verified) Outpatient Encounter Prescriptions as of 12/26/2016  Medication Sig  . cetirizine (ZYRTEC) 10 MG tablet Take 1 tablet (10 mg total) by mouth daily.  . fluticasone (FLONASE) 50 MCG/ACT nasal spray USE 2 SPRAYS IN BOTH NOSTRILS DAILY  . PARoxetine (PAXIL) 40 MG tablet TAKE 1 TABLET BY MOUTH DAILY FOR DEPRESSION  . gabapentin (NEURONTIN) 300 MG capsule Take 1 capsule (300 mg total) by mouth 3 (three) times daily. (Patient not taking: Reported on 12/26/2016)  . meloxicam (MOBIC) 15 MG tablet Take 1/2 or 1 daily as needed for pain (Patient not taking: Reported on 12/26/2016)  . terbinafine (LAMISIL) 250 MG tablet Take 1 tablet (250 mg total) by mouth daily. Take for 12 weeks for toenail fungus (Patient not taking: Reported on 12/26/2016)   No facility-administered encounter medications on file as of 12/26/2016.     Allergies (verified) Dust  mite mixed allergen ext [mite (d. farinae)]; Gabapentin; Latex; and Molds & smuts   History: Past Medical History:  Diagnosis Date  . Allergy   . Depression    Past Surgical History:  Procedure Laterality Date  . admission  09/24/2003   diverticulitis.    [name] BLEPHAROPLASTY    . CESAREAN SECTION    . TONSILLECTOMY     Family History  Problem Relation Age of Onset  . Hypertension Brother   . Throat cancer Father   . Colon cancer Neg Hx   . Esophageal cancer Neg Hx   . Stomach cancer Neg Hx   . Rectal cancer Neg Hx    Social History   Occupational History  . N/A    Social History Main Topics  . Smoking status: Never Smoker  . Smokeless tobacco: Never Used  . Alcohol use 0.0 oz/week     Comment: Maybe once every 6 months  . Drug use: No  . Sexual activity: No    Tobacco Counseling Counseling given: No   Activities of Daily Living In your present state of health, do you have any difficulty performing the following activities: 12/26/2016  Hearing? N  Vision? N  Difficulty concentrating or making decisions? N  Walking or climbing stairs? N  Dressing or bathing? N  Doing errands, shopping? N  Preparing Food and eating ? N  Using the Toilet? N  In  the past six months, have you accidently leaked urine? Y  Do you have problems with loss of bowel control? N  Managing your Medications? N  Managing your Finances? N  Housekeeping or managing your Housekeeping? N  Some recent data might be hidden    Immunizations and Health Maintenance Immunization History  Administered Date(s) Administered  . Influenza Split 07/01/2013  . Influenza,inj,Quad PF,36+ Mos 06/21/2014, 06/16/2015  . Zoster 06/08/2013   Health Maintenance Due  Topic Date Due  . Hepatitis C Screening  Aug 07, 1951  . TETANUS/TDAP  08/05/1969  . MAMMOGRAM  08/05/2000  . DEXA SCAN  08/06/2015  . PNA vac Low Risk Adult (1 of 2 - PCV13) 08/06/2015    Patient Care Team: Pearline Cables, MD as PCP -  General (Family Medicine) Julio Sicks, MD as Consulting Physician (Neurosurgery)  Indicate any recent Medical Services you may have received from other than [facility] providers in the past year (date may be approximate).     Assessment:   This is a routine wellness examination for Cathy Gallegos. Physical assessment deferred to PCP.  Hearing/Vision screen  Visual Acuity Screening   Right eye Left eye Both eyes  Without correction:  With correction:       Dietary issues and exercise activities discussed: Current Exercise Habits: The patient does not participate in regular exercise at present (works part time), Exercise limited by: None identified  Diet (meal preparation, eat out, water intake, caffeinated beverages, dairy products, fruits and vegetables): in general, an "unhealthy" diet Breakfast: Fast food. OJ, diet pepst Lunch: Sandwich and soda Dinner:  Sandwich and soda Drink about 2 glasses of water per day.  Goals    . Weight (lb) < 150 lb (68 kg)      Depression Screen PHQ 2/9 Scores 12/26/2016 11/17/2015 09/04/2015 06/16/2015  PHQ - 2 Score 2 0 6 0  PHQ- 9 Score 6 - 26 -    Fall Risk Fall Risk  12/26/2016 10/16/2015  Falls in the past year? No Yes  Number falls in past yr: - 1  Injury with Fall? - Yes  Follow up - Falls prevention discussed    Cognitive Function: Ad8 score reviewed for issues:  Issues making decisions:no  Less interest in hobbies / activities:no  Repeats questions, stories (family complaining):no  Trouble using ordinary gadgets (microwave, computer, phone):no  Forgets the month or year: no  Mismanaging finances: no  Remembering appts:no  Daily problems with thinking and/or memory:no Ad8 score is=0            Screening Tests Health Maintenance  Topic Date Due  . Hepatitis C Screening  1951/02/15  . TETANUS/TDAP  08/05/1969  . MAMMOGRAM  08/05/2000  . DEXA SCAN  08/06/2015  . PNA vac Low Risk Adult (1 of 2 - PCV13)  08/06/2015  . INFLUENZA VACCINE  04/23/2017  . COLONOSCOPY  06/29/2025      Plan:   Follow up with Dr.Copland as scheduled.  Eat heart healthy diet (full of fruits, vegetables, whole grains, lean protein, water--limit salt, fat, and sugar intake) and increase physical activity as tolerated.  Schedule Mammogram and bone scan--orders placed today.  Schedule appointment with eye doctor.  Review and consider-counseling referral brochure.   During the course of the visit, Mikal was educated and counseled about the following appropriate screening and preventive services:   Vaccines to include Pneumoccal, Influenza, Td, HCV  Cardiovascular disease screening  Colorectal cancer screening  Bone density screening  Diabetes screening  Glaucoma screening  Mammography/PAP  Nutrition counseling  Smoking cessation counseling  Patient Instructions (the written plan) were given to the patient.    Avon Gully, RN   12/26/2016    I have reviewed the above note by Ms. Ellajane Stong and agree with her documentation

## 2016-12-26 ENCOUNTER — Encounter: Payer: Self-pay | Admitting: *Deleted

## 2016-12-26 ENCOUNTER — Ambulatory Visit (INDEPENDENT_AMBULATORY_CARE_PROVIDER_SITE_OTHER): Payer: PPO | Admitting: *Deleted

## 2016-12-26 VITALS — BP 130/76 | HR 78 | Ht 64.0 in | Wt 169.0 lb

## 2016-12-26 DIAGNOSIS — Z780 Asymptomatic menopausal state: Secondary | ICD-10-CM

## 2016-12-26 DIAGNOSIS — Z0000 Encounter for general adult medical examination without abnormal findings: Secondary | ICD-10-CM

## 2016-12-26 DIAGNOSIS — Z1239 Encounter for other screening for malignant neoplasm of breast: Secondary | ICD-10-CM

## 2016-12-26 NOTE — Patient Instructions (Signed)
Follow up with Dr.Copland as scheduled.  Eat heart healthy diet (full of fruits, vegetables, whole grains, lean protein, water--limit salt, fat, and sugar intake) and increase physical activity as tolerated.  Schedule Mammogram and bone scan--orders placed today.  Schedule appointment with eye doctor.  Review and consider-counseling referral brochure. Fat and Cholesterol Restricted Diet Getting too much fat and cholesterol in your diet may cause health problems. Following this diet helps keep your fat and cholesterol at normal levels. This can keep you from getting sick. What types of fat should I choose?  Choose monosaturated and polyunsaturated fats. These are found in foods such as olive oil, canola oil, flaxseeds, walnuts, almonds, and seeds.  Eat more omega-3 fats. Good choices include salmon, mackerel, sardines, tuna, flaxseed oil, and ground flaxseeds.  Limit saturated fats. These are in animal products such as meats, butter, and cream. They can also be in plant products such as palm oil, palm kernel oil, and coconut oil.  Avoid foods with partially hydrogenated oils in them. These contain trans fats. Examples of foods that have trans fats are stick margarine, some tub margarines, cookies, crackers, and other baked goods. What general guidelines do I need to follow?  Check food labels. Look for the words "trans fat" and "saturated fat."  When preparing a meal:  Fill half of your plate with vegetables and green salads.  Fill one fourth of your plate with whole grains. Look for the word "whole" as the first word in the ingredient list.  Fill one fourth of your plate with lean protein foods.  Eat more foods that have fiber, like apples, carrots, beans, peas, and barley.  Eat more home-cooked foods. Eat less at restaurants and buffets.  Limit or avoid alcohol.  Limit foods high in starch and sugar.  Limit fried foods.  Cook foods without frying them. Baking, boiling,  grilling, and broiling are all great options.  Lose weight if you are overweight. Losing even a small amount of weight can help your overall health. It can also help prevent diseases such as diabetes and heart disease. What foods can I eat? Grains  Whole grains, such as whole wheat or whole grain breads, crackers, cereals, and pasta. Unsweetened oatmeal, bulgur, barley, quinoa, or brown rice. Corn or whole wheat flour tortillas. Vegetables  Fresh or frozen vegetables (raw, steamed, roasted, or grilled). Green salads. Fruits  All fresh, canned (in natural juice), or frozen fruits. Meat and Other Protein Products  Ground beef (85% or leaner), grass-fed beef, or beef trimmed of fat. Skinless chicken or [place]. Ground chicken or [place]. Pork trimmed of fat. All fish and seafood. Eggs. Dried beans, peas, or lentils. Unsalted nuts or seeds. Unsalted canned or dry beans. Dairy  Low-fat dairy products, such as skim or 1% milk, 2% or reduced-fat cheeses, low-fat ricotta or cottage cheese, or plain low-fat yogurt. Fats and Oils  Tub margarines without trans fats. Light or reduced-fat mayonnaise and salad dressings. Avocado. Olive, canola, sesame, or safflower oils. Natural peanut or almond butter (choose ones without added sugar and oil). The items listed above may not be a complete list of recommended foods or beverages. Contact your dietitian for more options.  What foods are not recommended? Grains  White bread. White pasta. White rice. Cornbread. Bagels, pastries, and croissants. Crackers that contain trans fat. Vegetables  White potatoes. Corn. Creamed or fried vegetables. Vegetables in a cheese sauce. Fruits  Dried fruits. Canned fruit in light or heavy syrup. Fruit juice. Meat and Other  Protein Products  Fatty cuts of meat. Ribs, chicken wings, bacon, sausage, bologna, salami, chitterlings, fatback, hot dogs, bratwurst, and packaged luncheon meats. Liver and organ meats. Dairy  Whole or 2%  milk, cream, half-and-half, and cream cheese. Whole milk cheeses. Whole-fat or sweetened yogurt. Full-fat cheeses. Nondairy creamers and whipped toppings. Processed cheese, cheese spreads, or cheese curds. Sweets and Desserts  Corn syrup, sugars, honey, and molasses. Candy. Jam and jelly. Syrup. Sweetened cereals. Cookies, pies, cakes, donuts, muffins, and ice cream. Fats and Oils  Butter, stick margarine, lard, shortening, ghee, or bacon fat. Coconut, palm kernel, or palm oils. Beverages  Alcohol. Sweetened drinks (such as sodas, lemonade, and fruit drinks or punches). The items listed above may not be a complete list of foods and beverages to avoid. Contact your dietitian for more information.  This information is not intended to replace advice given to you by your health care provider. Make sure you discuss any questions you have with your health care provider. Document Released: 03/10/2012 Document Revised: 05/16/2016 Document Reviewed: 12/09/2013 Elsevier Interactive Patient Education  2017 [employer].

## 2017-01-02 ENCOUNTER — Other Ambulatory Visit (HOSPITAL_COMMUNITY)
Admission: RE | Admit: 2017-01-02 | Discharge: 2017-01-02 | Disposition: A | Discharge status: Home / Self Care | Payer: PPO | Source: Ambulatory Visit | Attending: Family Medicine | Admitting: Family Medicine

## 2017-01-02 ENCOUNTER — Ambulatory Visit (INDEPENDENT_AMBULATORY_CARE_PROVIDER_SITE_OTHER): Payer: PPO | Admitting: Family Medicine

## 2017-01-02 VITALS — BP 126/78 | HR 74 | Temp 98.5°F | Ht 64.0 in | Wt 167.4 lb

## 2017-01-02 DIAGNOSIS — Z1159 Encounter for screening for other viral diseases: Secondary | ICD-10-CM

## 2017-01-02 DIAGNOSIS — Z124 Encounter for screening for malignant neoplasm of cervix: Secondary | ICD-10-CM | POA: Insufficient documentation

## 2017-01-02 DIAGNOSIS — Z130 Encounter for screening for diseases of the blood and blood-forming organs and certain disorders involving the immune mechanism: Secondary | ICD-10-CM | POA: Diagnosis not present

## 2017-01-02 DIAGNOSIS — R946 Abnormal results of thyroid function studies: Secondary | ICD-10-CM

## 2017-01-02 DIAGNOSIS — Z23 Encounter for immunization: Secondary | ICD-10-CM

## 2017-01-02 DIAGNOSIS — R7989 Other specified abnormal findings of blood chemistry: Secondary | ICD-10-CM

## 2017-01-02 DIAGNOSIS — G4710 Hypersomnia, unspecified: Secondary | ICD-10-CM | POA: Diagnosis not present

## 2017-01-02 DIAGNOSIS — N3944 Nocturnal enuresis: Secondary | ICD-10-CM

## 2017-01-02 DIAGNOSIS — Z131 Encounter for screening for diabetes mellitus: Secondary | ICD-10-CM

## 2017-01-02 DIAGNOSIS — Z13220 Encounter for screening for lipoid disorders: Secondary | ICD-10-CM

## 2017-01-02 LAB — COMPREHENSIVE METABOLIC PANEL
ALBUMIN: 4 g/dL (ref 3.5–5.2)
ALK PHOS: 94 U/L (ref 39–117)
ALT: 10 U/L (ref 0–35)
AST: 14 U/L (ref 0–37)
BILIRUBIN TOTAL: 0.3 mg/dL (ref 0.2–1.2)
BUN: 19 mg/dL (ref 6–23)
CALCIUM: 9.5 mg/dL (ref 8.4–10.5)
CO2: 30 mEq/L (ref 19–32)
CREATININE: 0.8 mg/dL (ref 0.40–1.20)
Chloride: 104 mEq/L (ref 96–112)
GFR: 76.18 mL/min (ref >60.00)
Glucose, Bld: 88 mg/dL (ref 70–99)
Potassium: 3.7 mEq/L (ref 3.5–5.1)
Sodium: 139 mEq/L (ref 135–145)
TOTAL PROTEIN: 6.8 g/dL (ref 6.0–8.3)

## 2017-01-02 LAB — CBC
HCT: 42.3 % (ref 36.0–46.0)
Hemoglobin: 13.6 g/dL (ref 12.0–15.0)
MCHC: 32.1 g/dL (ref 30.0–36.0)
MCV: 82.8 fl (ref 78.0–100.0)
PLATELETS: 253 10*3/uL (ref 150.0–400.0)
RBC: 5.11 Mil/uL (ref 3.87–5.11)
RDW: 15 % (ref 11.5–15.5)
WBC: 8.1 10*3/uL (ref 4.0–10.5)

## 2017-01-02 LAB — LIPID PANEL
CHOLESTEROL: 191 mg/dL (ref 0–200)
HDL: 52.4 mg/dL (ref >39.00)
LDL Cholesterol: 120 mg/dL — ABNORMAL HIGH (ref 0–99)
NonHDL: 139.07
Total CHOL/HDL Ratio: 4
Triglycerides: 93 mg/dL (ref 0.0–149.0)
VLDL: 18.6 mg/dL (ref 0.0–40.0)

## 2017-01-02 LAB — HEPATITIS C ANTIBODY: HCV Ab: NEGATIVE

## 2017-01-02 LAB — TSH: TSH: 3.23 u[IU]/mL (ref 0.35–4.50)

## 2017-01-02 NOTE — Patient Instructions (Addendum)
It seems like you are excessively sleepy and tired.  I wonder if you may have sleep apnea which causes you to not sleep well.  This may even contribute to your bedwetting situation as you are so tired that you do not wake up.    I will arrange for you to see a neurologist and have a sleep study We also got your pap today- I will be in touch with this result and your blood work asap  If you do indeed have sleep apnea, treating it will likely help your mood. However, if you feel like your mood is getting worse or you have any thoughts of self- harm please seek care right away  Please have blood drawn today You also got your pneumonia vaccine today!

## 2017-01-02 NOTE — Progress Notes (Signed)
[facility] [address], [address] [place], [place] [address] [phone]  Date:  01/02/2017   Name:  Cathy Gallegos   DOB:  Sep 06, 1951   MRN:  [id number]  PCP:  Abbe Amsterdam, MD    Chief Complaint: Urinary Incontinence (c/o urinary incontinence at night. Pt states that she has had trouble with wetting the bed for over a year now. Due for PAP. Would like to discuss depression, currently taking Paxil but feels that depression has graudally worsened. Pt has place behind her left ear that she would like looked at. )   History of Present Illness:  Cathy Gallegos is a 66 y.o. very pleasant female patient who presents with the following:  Last seen by myself in August of 2017.   She was at that time bothered by persistent bedwetting- we had her see urology.  They had her try some sort of medication which helped for a little while but then seemed to stop helping. She has tried cutting out caffeine.  However notes that she continues to empty her bladder in bed on a nightly basis  She also underwent eyelid surgery not long ago- her operation was in November.  She is overall happy with her results but wishes her lids were a little bit higher- however if they brought these up any higher she would not be able to close her eyes. She is able to see more easily  She notes that she feels very tired in the evening, will go to sleep very heavily and does not wake up to urinate during the night. She also feels exhausted and depleted during the day.  Wonders if she is not getting good rest at night  Her last pap was done approx 8 years ago.  Never had an abnl pap.   She is post- menopausal and does not bleed any longer.   She notes some depression due to "my life more than anything," she often dreams about family members who are passed away or not very present in her life.  She has 2 brothers- she is in touch with one of them.  Her other brother does not  have contact with her She notes that she is often tired and sleeps a lot.  She tends to do a lot of thritfting.  She works part time; 12- 15 hours.  She has tried to work more but it makes her too tired.  She would like to be more social and get out more but often feels too tired She is on paxil She is aware that she needs a mammo and dexa but does not feel up to this right now  Patient Active Problem List   Diagnosis Date Noted  . Ptosis 11/09/2015  . Abnormality of gait 10/16/2015  . Urinary incontinence 10/16/2015    Past Medical History:  Diagnosis Date  . Allergy   . Depression     Past Surgical History:  Procedure Laterality Date  . admission  09/24/2003   diverticulitis.    [name] BLEPHAROPLASTY    . CESAREAN SECTION    . TONSILLECTOMY      Social History  Substance Use Topics  . Smoking status: Never Smoker  . Smokeless tobacco: Never Used  . Alcohol use 0.0 oz/week     Comment: Maybe once every 6 months    Family History  Problem Relation Age of Onset  . Hypertension Brother   . Throat cancer Father   .  Colon cancer Neg Hx   . Esophageal cancer Neg Hx   . Stomach cancer Neg Hx   . Rectal cancer Neg Hx     Allergies  Allergen Reactions  . Dust Mite Mixed Allergen Ext [Mite (D. Farinae)]   . Gabapentin Nausea Only  . Latex Swelling and Other (See Comments)  . Molds & Smuts     Medication list has been reviewed and updated.  Current Outpatient Prescriptions on File Prior to Visit  Medication Sig Dispense Refill  . cetirizine (ZYRTEC) 10 MG tablet Take 1 tablet (10 mg total) by mouth daily. 90 tablet 3  . fluticasone (FLONASE) 50 MCG/ACT nasal spray USE 2 SPRAYS IN BOTH NOSTRILS DAILY 16 g 9  . PARoxetine (PAXIL) 40 MG tablet TAKE 1 TABLET BY MOUTH DAILY FOR DEPRESSION 90 tablet 3   No current facility-administered medications on file prior to visit.     Review of Systems:  As per HPI- otherwise negative. She felt like her mood was ok until about 2  months ago She denies any SI   Physical Examination: Vitals:   01/02/17 1131  BP: 126/78  Pulse: 74  Temp: 98.5 F (36.9 C)   Vitals:   01/02/17 1131  Weight: 167 lb 6.4 oz (75.9 kg)  Height:  (1.626 m)   Body mass index is 28.73 kg/m. Ideal Body Weight: Weight in (lb) to have BMI = 25: 145.3  GEN: WDWN, NAD, Non-toxic, A & O x 3, overweight, looks well otherwise  HEENT: Atraumatic, Normocephalic. Neck supple. No masses, No LAD. Ears and Nose: No external deformity. CV: RRR, No M/G/R. No JVD. No thrill. No extra heart sounds. PULM: CTA B, no wheezes, crackles, rhonchi. No retractions. No resp. distress. No accessory muscle use. ABD: S, NT, ND EXTR: No c/c/e NEURO Normal gait.  PSYCH: Normally interactive. Conversant. Not depressed or anxious appearing.  Calm demeanor.  Normal vulva, vagina, cervix and adnexa for age. Collected pap for her today   Assessment and Plan: Excessive sleepiness - Plan: Ambulatory referral to Neurology  Screening for cervical cancer - Plan: Cytology - PAP  Screening for deficiency anemia - Plan: CBC  Screening for diabetes mellitus - Plan: Comprehensive metabolic panel  Abnormal TSH - Plan: TSH  Encounter for hepatitis C screening test for low risk patient - Plan: Hepatitis C antibody  Screening for hyperlipidemia - Plan: Lipid panel  Immunization due - Plan: Pneumococcal conjugate vaccine 13-valent IM  Here today with a few concerns Labs, pap pending Gave pneumonia vaccine today Will arrange for her to see neurolgoy- wonder if she has sleep apnea or another sleep disorder which may be responsible for her exhaustion and also her bedwetting issue  Signed Abbe Amsterdam, MD

## 2017-01-06 ENCOUNTER — Encounter: Payer: Self-pay | Admitting: Family Medicine

## 2017-01-06 ENCOUNTER — Ambulatory Visit: Payer: PPO | Admitting: Family Medicine

## 2017-01-06 LAB — CYTOLOGY - PAP
Diagnosis: NEGATIVE
HPV: NOT DETECTED

## 2017-01-07 ENCOUNTER — Telehealth: Payer: Self-pay | Admitting: *Deleted

## 2017-01-07 ENCOUNTER — Institutional Professional Consult (permissible substitution): Payer: PPO | Admitting: Neurology

## 2017-01-07 ENCOUNTER — Telehealth: Payer: Self-pay

## 2017-01-07 NOTE — Telephone Encounter (Signed)
I called pt to get her appt today at 1:30 pm r/s since [facility] is not operational at this time. No answer, no VM set up. I have called twice.

## 2017-01-07 NOTE — Telephone Encounter (Signed)
error 

## 2017-01-07 NOTE — Telephone Encounter (Signed)
Pt called office back. I Adriana Reams, RN and okay to r/s appt from today to tomorrow in office visit slot. Scheduled for 1130am. Advised patient to check in at 11:00am. She verbalized understanding.

## 2017-01-08 ENCOUNTER — Encounter: Payer: Self-pay | Admitting: Neurology

## 2017-01-08 ENCOUNTER — Ambulatory Visit (INDEPENDENT_AMBULATORY_CARE_PROVIDER_SITE_OTHER): Payer: PPO | Admitting: Neurology

## 2017-01-08 VITALS — BP 118/78 | HR 72 | Ht 64.0 in | Wt 170.0 lb

## 2017-01-08 DIAGNOSIS — R32 Unspecified urinary incontinence: Secondary | ICD-10-CM

## 2017-01-08 DIAGNOSIS — E663 Overweight: Secondary | ICD-10-CM

## 2017-01-08 DIAGNOSIS — G4750 Parasomnia, unspecified: Secondary | ICD-10-CM | POA: Diagnosis not present

## 2017-01-08 DIAGNOSIS — R6889 Other general symptoms and signs: Secondary | ICD-10-CM

## 2017-01-08 DIAGNOSIS — R51 Headache: Secondary | ICD-10-CM | POA: Diagnosis not present

## 2017-01-08 DIAGNOSIS — R400 Somnolence: Secondary | ICD-10-CM

## 2017-01-08 DIAGNOSIS — R519 Headache, unspecified: Secondary | ICD-10-CM

## 2017-01-08 DIAGNOSIS — F518 Other sleep disorders not due to a substance or known physiological condition: Secondary | ICD-10-CM | POA: Diagnosis not present

## 2017-01-08 NOTE — Progress Notes (Signed)
Subjective:    Patient ID: Cathy Gallegos is a 66 y.o. female.  HPI     Huston Foley, MD, PhD [facility] [address] [address] [place], [place] [address]   Dear Dr. Patsy Lager,   I saw your patient, Cathy Gallegos, upon your kind request in my neurologic clinic today for initial consultation of her sleep disorder, in particular, concern for underlying obstructive sleep apnea. The patient is unaccompanied today. As you know, Ms. Mckenna is a 66 year old right-handed woman with an underlying medical history of allergies, depression, diverticulosis, bedwetting, Gait disorder and ptosis for which she saw my colleague Dr. Terrace Arabia several times, and overweight state, who reports excessive daytime somnolence. I reviewed your office note from 01/02/2017. She reports sleeping very deeply. She has had nighttime urinary incontinence and bedwetting for the past year or so. Her Epworth sleepiness score is 10 out of 24, fatigue score is 56 out of 63. She is unsure if she snores. Denies RLS symptoms, has some pain R leg.  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  She has a variable sleep and wake schedule. She lives alone. She is divorced. She has no children. She has 2 cats. She has a TV in her bedroom which tends to stay on at night. She endorses residual depression, daytime tiredness, nonrestorative sleep, has had rare morning headaches. For the past 6-12 months she has had more vivid dreams, has had bedwetting for the past 12+ months. She is no longer on any medication for this, tried something through urology. She has no family history of OSA. She has a history of sleep talking. She has reduced her caffeine intake per urology recommendation. She is a nonsmoker and does not drink alcohol on a regular basis.  Her Past Medical History Is Significant For: Past Medical History:  Diagnosis Date  . Allergy   . Depression     Her Past Surgical History Is Significant  For: Past Surgical History:  Procedure Laterality Date  . admission  09/24/2003   diverticulitis.    [name] BLEPHAROPLASTY    . CESAREAN SECTION    . TONSILLECTOMY      Her Family History Is Significant For: Family History  Problem Relation Age of Onset  . Hypertension Brother   . Throat cancer Father   . Colon cancer Neg Hx   . Esophageal cancer Neg Hx   . Stomach cancer Neg Hx   . Rectal cancer Neg Hx     Her Social History Is Significant For: Social History   Social History  . Marital status: Divorced    Spouse name: N/A  . Number of children: 0  . Years of education: BA    Occupational History  . N/A    Social History Main Topics  . Smoking status: Never Smoker  . Smokeless tobacco: Never Used  . Alcohol use 0.0 oz/week     Comment: Maybe once every 6 months  . Drug use: No  . Sexual activity: No   Other Topics Concern  . None   Social History Narrative   Marital status: single/widowed.      Children: none      Lives: alone      Employment: unemployed;       Tobacco: none      Alcohol: rare      Exercise:  Walking; going to pool.   Drinks diet pepsi daily and recently will drink coffee every so often  Her Allergies Are:  Allergies  Allergen Reactions  . Dust Mite Mixed Allergen Ext [Mite (D. Farinae)]   . Gabapentin Nausea Only  . Latex Swelling and Other (See Comments)  . Molds & Smuts   :   Her Current Medications Are:  Outpatient Encounter Prescriptions as of 01/08/2017  Medication Sig  . cetirizine (ZYRTEC) 10 MG tablet Take 1 tablet (10 mg total) by mouth daily.  . fluticasone (FLONASE) 50 MCG/ACT nasal spray USE 2 SPRAYS IN BOTH NOSTRILS DAILY  . PARoxetine (PAXIL) 40 MG tablet TAKE 1 TABLET BY MOUTH DAILY FOR DEPRESSION   No facility-administered encounter medications on file as of 01/08/2017.   :  Review of Systems:  Out of a complete 14 point review of systems, all are reviewed and negative with the exception of these symptoms as listed  below: Review of Systems  Neurological:       Pt presents today to discuss her sleep. Pt has never had a sleep study and pt is unsure if she snores. Pt says that she is very exhausted and can sleep 12-14 hours per day.  Epworth Sleepiness Scale 0= would never doze 1= slight chance of dozing 2= moderate chance of dozing 3= high chance of dozing  Sitting and reading: 2 Watching TV: 3 Sitting inactive in a public place (ex. Theater or meeting): 1 As a passenger in a car for an hour without a break: n/a Lying down to rest in the afternoon: 3 Sitting and talking to someone: 0 Sitting quietly after lunch (no alcohol): 1 In a car, while stopped in traffic: 0 Total: 10 (with one question n/a)     Objective:  Neurologic Exam  Physical Exam Physical Examination:   Vitals:   01/08/17 1125  BP: 118/78  Pulse: 72   General Examination: The patient is a very pleasant 66 y.o. female in no acute distress. She appears well-developed and well-nourished and adequately groomed.   HEENT: Normocephalic, atraumatic, pupils are equal, round and reactive to light and accommodation. Extraocular tracking is good without limitation to gaze excursion or nystagmus noted. Normal smooth pursuit is noted. Hearing is grossly intact. Face is symmetric with normal facial animation and normal facial sensation. Speech is clear with no dysarthria noted. There is no hypophonia. There is no lip, neck/head, jaw or voice tremor. Neck is supple with full range of passive and active motion. There are no carotid bruits on auscultation. Oropharynx exam reveals: mild mouth dryness, adequate dental hygiene and mild airway crowding, due to longer uvula. Mallampati is class II. Tongue protrudes centrally and palate elevates symmetrically. Tonsils are absent. Neck size is 15.75 inches. She has a Mild overbite.   Chest: Clear to auscultation without wheezing, rhonchi or crackles noted.  Heart: S1+S2+0, regular and normal without  murmurs, rubs or gallops noted.   Abdomen: Soft, non-tender and non-distended with normal bowel sounds appreciated on auscultation.  Extremities: There is no pitting edema in the distal lower extremities bilaterally. Pedal pulses are intact.  Skin: Warm and dry without trophic changes noted.  Musculoskeletal: exam reveals no obvious joint deformities, tenderness or joint swelling or erythema.   Neurologically:  Mental status: The patient is awake, alert and oriented in all 4 spheres. Her immediate and remote memory, attention, language skills and fund of knowledge are appropriate. There is no evidence of aphasia, agnosia, apraxia or anomia. Speech is clear with normal prosody and enunciation. Thought process is linear. Mood is decreased range and affect is blunted.  Cranial  nerves II - XII are as described above under HEENT exam. In addition: shoulder shrug is normal with equal shoulder height noted. Motor exam: Normal bulk, strength and tone is noted. There is no drift, tremor or rebound. Romberg is negative. Reflexes are 2+ throughout. Babinski: Toes are flexor bilaterally. Fine motor skills and coordination: intact with normal finger taps, normal hand movements, normal rapid alternating patting, normal foot taps and normal foot agility.  Cerebellar testing: No dysmetria or intention tremor on finger to nose testing. Heel to shin is unremarkable bilaterally. There is no truncal or gait ataxia.  Sensory exam: intact to light touch in the upper and lower extremities.  Gait, station and balance: She stands easily. No veering to one side is noted. No leaning to one side is noted. Posture is age-appropriate and stance is narrow based. Gait shows normal stride length and normal pace. No problems turning are noted. Tandem walk is difficult for her.   Assessment and Plan:   In summary, ZAEDA MCFERRAN is a very pleasant 66 y.o.-year old female  with an underlying medical history of allergies, depression,  diverticulosis, bedwetting, gait disorder and ptosis, and overweight state, whose history and physical exam are concerning for obstructive sleep apnea (OSA). I had a long chat with the patient about my findings and the diagnosis of OSA, its prognosis and treatment options. We talked about medical treatments, surgical interventions and non-pharmacological approaches. I explained in particular the risks and ramifications of untreated moderate to severe OSA, especially with respect to developing cardiovascular disease down the Road, including congestive heart failure, difficult to treat hypertension, cardiac arrhythmias, or stroke. Even type 2 diabetes has, in part, been linked to untreated OSA. Symptoms of untreated OSA include daytime sleepiness, memory problems, mood irritability and mood disorder such as depression and anxiety, lack of energy, as well as recurrent headaches, especially morning headaches. We talked about trying to maintain a healthy lifestyle in general, as well as the importance of weight control. I encouraged the patient to eat healthy, exercise daily and keep well hydrated, to keep a scheduled bedtime and wake time routine, to not skip any meals and eat healthy snacks in between meals. I advised the patient not to drive when feeling sleepy. I recommended the following at this time: sleep study with potential positive airway pressure titration. (We will score hypopneas at 4%).   I explained the sleep test procedure to the patient and also outlined possible surgical and non-surgical treatment options of OSA, including the use of a custom-made dental device (which would require a referral to a specialist dentist or oral surgeon), upper airway surgical options, such as pillar implants, radiofrequency surgery, tongue base surgery, and UPPP (which would involve a referral to an ENT surgeon). Rarely, jaw surgery such as mandibular advancement may be considered.  I also explained the CPAP treatment  option to the patient, who indicated that she would be willing to try CPAP if the need arises. I explained the importance of being compliant with PAP treatment, not only for insurance purposes but primarily to improve Her symptoms, and for the patient's long term health benefit, including to reduce Her cardiovascular risks. I answered all her questions today and the patient was in agreement. I would like to see her back after the sleep study is completed and encouraged her to call with any interim questions, concerns, problems or updates.   Thank you very much for allowing me to participate in the care of this nice patient. If  I can be of any further assistance to you please do not hesitate to call me at [phone].  Sincerely,   Star Age, MD, PhD

## 2017-01-08 NOTE — Patient Instructions (Signed)

## 2017-01-20 DIAGNOSIS — Z9889 Other specified postprocedural states: Secondary | ICD-10-CM | POA: Diagnosis not present

## 2017-01-20 DIAGNOSIS — H02423 Myogenic ptosis of bilateral eyelids: Secondary | ICD-10-CM | POA: Diagnosis not present

## 2017-01-20 DIAGNOSIS — Z7722 Contact with and (suspected) exposure to environmental tobacco smoke (acute) (chronic): Secondary | ICD-10-CM | POA: Diagnosis not present

## 2017-01-20 DIAGNOSIS — Z888 Allergy status to other drugs, medicaments and biological substances status: Secondary | ICD-10-CM | POA: Diagnosis not present

## 2017-01-30 NOTE — Telephone Encounter (Signed)
Seen 4 5 18 

## 2017-03-09 ENCOUNTER — Ambulatory Visit (INDEPENDENT_AMBULATORY_CARE_PROVIDER_SITE_OTHER): Payer: PPO | Admitting: Neurology

## 2017-03-09 DIAGNOSIS — G4720 Circadian rhythm sleep disorder, unspecified type: Secondary | ICD-10-CM

## 2017-03-09 DIAGNOSIS — G4733 Obstructive sleep apnea (adult) (pediatric): Secondary | ICD-10-CM

## 2017-03-09 DIAGNOSIS — G4761 Periodic limb movement disorder: Secondary | ICD-10-CM

## 2017-03-12 ENCOUNTER — Telehealth: Payer: Self-pay

## 2017-03-12 NOTE — Progress Notes (Signed)
Pt ref by Dr. Patsy Lageropland, seen by me on 01/08/17, PSG on 03/09/17.  Please call and notify the patient that the recent sleep study did show overall mild obstructive sleep apnea. Please inform patient that I would like to go over the details of the study and treatment options during a follow up appointment and if not already previously scheduled, arrange a followup appointment (please utilize a followu-up slot). Also, route or fax report to PCP and referring MD, if other than PCP.  Once you have spoken to patient, you can close this encounter.   Thanks,  Huston FoleySaima Jackelynn Hosie, MD, PhD [facility])

## 2017-03-12 NOTE — Procedures (Signed)
PATIENT'S NAME:  Cathy Gallegos, Cathy Gallegos DOB:      15-May-1951      MR#:    [id number]     DATE OF RECORDING: 03/09/2017 REFERRING M.D.:  Abbe Amsterdam MD Study Performed:   Baseline Polysomnogram HISTORY: 66 year old woman with a history of allergies, depression, diverticulosis, bedwetting, Gait disorder, ptosis and overweight state, who reports excessive daytime somnolence. The patient endorsed the Epworth Sleepiness Scale at 10/24 points. The patient's weight 170 pounds with a height of 64 (inches), resulting in a BMI of 29. kg/m2. The patient's neck circumference measured 15.8 inches.  CURRENT MEDICATIONS: Cetirizine, Fluticasone and Paroxetine   PROCEDURE:  This is a multichannel digital polysomnogram utilizing the Somnostar 11.2 system.  Electrodes and sensors were applied and monitored per AASM Specifications.   EEG, EOG, Chin and Limb EMG, were sampled at 200 Hz.  ECG, Snore and Nasal Pressure, Thermal Airflow, Respiratory Effort, CPAP Flow and Pressure, Oximetry was sampled at 50 Hz. Digital video and audio were recorded.      BASELINE STUDY  Lights Out was at 20:54 and Lights On at 05:00.  Total recording time (TRT) was 487 minutes, with a total sleep time (TST) of  421.5 minutes.   The patient's sleep latency was 41.5 minutes, which is delayed. REM latency was 196 minutes, which is delayed. The sleep efficiency was 86.6 %.     SLEEP ARCHITECTURE: WASO (Wake after sleep onset) was 40.5 minutes with mild to moderate sleep fragmentation noted. There were 20 minutes in Stage N1, 348.5 minutes Stage N2, 28 minutes Stage N3 and  minutes in Stage REM. The percentage of Stage N1 was 4.7%, Stage N2 was 82.7%, which is markedly increased, Stage N3 was 6.6%, which is reduced, and Stage R (REM sleep) was 5.9%, which is significantly reduced.  The arousals were noted as: 44 were spontaneous, 27 were associated with PLMs, 56 were associated with respiratory events.    Audio and video analysis did not show any  abnormal or unusual movements, behaviors, phonations or vocalizations. The patient took no bathroom breaks. Mild intermittent snoring was noted. The EKG was in keeping with normal sinus rhythm (NSR).  RESPIRATORY ANALYSIS:  There were a total of 55 respiratory events:  7 obstructive apneas, 0 central apneas and 0 mixed apneas with a total of 7 apneas and an apnea index (AI) of 1. /hour. There were 48 hypopneas with a hypopnea index of 6.8 /hour. The patient also had 0 respiratory event related arousals (RERAs).      The total APNEA/HYPOPNEA INDEX (AHI) was 7.8/hour and the total RESPIRATORY DISTURBANCE INDEX was 7.8 /hour.  3 events occurred in REM sleep and 90 events in NREM. The REM AHI was 7.2 /hour, versus a non-REM AHI of 7.9. The patient spent 37 minutes of total sleep time in the supine position and 385 minutes in non-supine.. The supine AHI was 38.9 versus a non-supine AHI of 4.9.  OXYGEN SATURATION & C02:  The Wake baseline 02 saturation was 96%, with the lowest being 89%. Time spent below 89% saturation equaled 0 minutes.  PERIODIC LIMB MOVEMENTS:  The patient had a total of 120 Periodic Limb Movements.  The Periodic Limb Movement (PLM) index was 17.1 and the PLM Arousal index was 3.8/hour.  Post-study, the patient indicated that sleep was the same as usual.   IMPRESSION:  1. Obstructive Sleep Apnea (OSA) 2. Periodic Limb Movement Disorder (PLMD) 3. Dysfunctions associated with sleep stages or arousal from sleep  RECOMMENDATIONS:  1.  This study demonstrates overall mild obstructive sleep apnea, severe during supine sleep (by number of events), with a total AHI of 7.8/hour, REM AHI of 7.2/hour, supine AHI of 38.9/hour and O2 nadir of 89%. Treatment options include positive airway pressure, avoidance of supine sleep position along with weight loss, upper airway or jaw surgery in selected patients or the use of an oral appliance in certain patients. ENT evaluation and/or consultation with  a maxillofacial surgeon or dentist may be feasible in some instances.  2. Mild PLMs (periodic limb movements of sleep) were noted during this study with minimal arousals; clinical correlation is recommended. Medication effect from the antidepressant medication should be considered.  3. This study shows sleep fragmentation and abnormal sleep stage percentages; these are nonspecific findings and per se do not signify an intrinsic sleep disorder or a cause for the patient's sleep-related symptoms. Causes include (but are not limited to) the first night effect of the sleep study, circadian rhythm disturbances, medication effect or an underlying mood disorder or medical problem.  4. The patient should be cautioned not to drive, work at heights, or operate dangerous or heavy equipment when tired or sleepy. Review and reiteration of good sleep hygiene measures should be pursued with any patient. 5. The patient will [facility] discussion of the test results and further management strategies. The referring provider will be notified of the test results.   I certify that I have reviewed the entire raw data recording prior to the issuance of this report in accordance with the Standards of Accreditation of the American Academy of Sleep Medicine (AASM)   Huston FoleySaima Regan Mcbryar, MD, PhD Diplomat, American Board of Psychiatry and Neurology (Neurology and Sleep Medicine)

## 2017-03-12 NOTE — Telephone Encounter (Signed)
I called pt to discuss her sleep study results. No answer, VM full, unable to leave a message, will try again later.

## 2017-03-12 NOTE — Telephone Encounter (Signed)
-----   Message from Huston FoleySaima Athar, MD sent at 03/12/2017  8:20 AM EDT ----- Pt ref by Dr. Patsy Lageropland, seen by me on 01/08/17, PSG on 03/09/17.  Please call and notify the patient that the recent sleep study did show overall mild obstructive sleep apnea. Please inform patient that I would like to go over the details of the study and treatment options during a follow up appointment and if not already previously scheduled, arrange a followup appointment (please utilize a followu-up slot). Also, route or fax report to PCP and referring MD, if other than PCP.  Once you have spoken to patient, you can close this encounter.   Thanks,  Huston FoleySaima Athar, MD, PhD [facility])

## 2017-03-19 NOTE — Telephone Encounter (Signed)
I called pt. I advised her that Dr. Frances FurbishAthar reviewed her sleep study and found that she has mild osa and that Dr. Frances FurbishAthar would like to go over the details of the sleep study and treatment options during a follow up appt. Pt is agreeable to an appt on July 2nd, 2018 at 3:00pm with Dr. Frances FurbishAthar. Copy of sleep study sent to Dr. Patsy Lageropland per pt request. Pt verbalized understanding of results. Pt had no questions at this time but was encouraged to call back if questions arise.

## 2017-03-24 ENCOUNTER — Ambulatory Visit (INDEPENDENT_AMBULATORY_CARE_PROVIDER_SITE_OTHER): Payer: PPO | Admitting: Neurology

## 2017-03-24 ENCOUNTER — Encounter: Payer: Self-pay | Admitting: Neurology

## 2017-03-24 VITALS — BP 143/69 | HR 75 | Ht 65.0 in | Wt 170.0 lb

## 2017-03-24 DIAGNOSIS — G4761 Periodic limb movement disorder: Secondary | ICD-10-CM | POA: Diagnosis not present

## 2017-03-24 DIAGNOSIS — N39498 Other specified urinary incontinence: Secondary | ICD-10-CM | POA: Diagnosis not present

## 2017-03-24 DIAGNOSIS — G4733 Obstructive sleep apnea (adult) (pediatric): Secondary | ICD-10-CM | POA: Diagnosis not present

## 2017-03-24 DIAGNOSIS — F518 Other sleep disorders not due to a substance or known physiological condition: Secondary | ICD-10-CM

## 2017-03-24 DIAGNOSIS — R6889 Other general symptoms and signs: Secondary | ICD-10-CM

## 2017-03-24 NOTE — Progress Notes (Signed)
Subjective:    Patient ID: Cathy Gallegos is a 66 y.o. female.  HPI     Interim history:   Cathy Gallegos is a 66 year old right-handed woman with an underlying medical history of allergies, depression, diverticulosis, bedwetting, Gait disorder and ptosis (has seen Dr. Krista Blue), and overweight state, who presents for follow-up consultation of her sleep disturbance, after her recent sleep study. The patient is unaccompanied today. I first met her on 01/08/2017 at the request of her primary care physician, at which time she complained of excessive daytime somnolence and bedwetting. I invited her for a sleep study. She had a baseline sleep study on 03/09/2017. I went over her test results with her in detail today. Sleep efficiency was 86.6%, sleep latency prolonged at 41.5 minutes and REM latency was delayed at 196 minutes. She had 40.5 minutes of wake after sleep onset with mild to moderate sleep fragmentation. She had a markedly increased percentage of stage II sleep and a decreased percentage of slow-wave sleep and a markedly decreased percentage of REM sleep. She had mild intermittent snoring. Total AHI was 7.8 per hour, rising to 38.9 per hour during supine sleep. Average oxygen saturation was 96%, nadir was 89%. She had mild PLMS with an index of 17.1 and minimal arousals associated with PLMS.  Today, 03/24/2017 (all dictated new, as well as above notes, some dictation done in note pad or Word, outside of chart, may appear as copied):  She reports doing about the same, denies RLS Sx. Has noted poison ivy lesions both wrists yesterday, was outside. She takes Zyrtec daily. She has no new complaints, no recent illness, no recent medication changes.  The patient's allergies, current medications, family history, past medical history, past social history, past surgical history and problem list were reviewed and updated as appropriate.   Previously (copied from previous notes for reference):   01/08/17: She reports  excessive daytime somnolence. I reviewed your office note from 01/02/2017. She reports sleeping very deeply. She has had nighttime urinary incontinence and bedwetting for the past year or so. Her Epworth sleepiness score is 10 out of 24, fatigue score is 56 out of 63. She is unsure if she snores. Denies RLS symptoms, has some pain R leg.  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  She has a variable sleep and wake schedule. She lives alone. She is divorced. She has no children. She has 2 cats. She has a TV in her bedroom which tends to stay on at night. She endorses residual depression, daytime tiredness, nonrestorative sleep, has had rare morning headaches. For the past 6-12 months she has had more vivid dreams, has had bedwetting for the past 12+ months. She is no longer on any medication for this, tried something through urology. She has no family history of OSA. She has a history of sleep talking. She has reduced her caffeine intake per urology recommendation. She is a nonsmoker and does not drink alcohol on a regular basis.  Her Past Medical History Is Significant For: Past Medical History:  Diagnosis Date  . Allergy   . Depression     Her Past Surgical History Is Significant For: Past Surgical History:  Procedure Laterality Date  . admission  09/24/2003   diverticulitis.    [name] BLEPHAROPLASTY    . CESAREAN SECTION    . TONSILLECTOMY      Her Family History Is Significant For: Family History  Problem Relation Age of Onset  . Hypertension Brother   .  Throat cancer Father   . Colon cancer Neg Hx   . Esophageal cancer Neg Hx   . Stomach cancer Neg Hx   . Rectal cancer Neg Hx     Her Social History Is Significant For: Social History   Social History  . Marital status: Divorced    Spouse name: N/A  . Number of children: 0  . Years of education: BA    Occupational History  . N/A    Social History Main Topics  . Smoking status: Never Smoker  . Smokeless tobacco:  Never Used  . Alcohol use 0.0 oz/week     Comment: Maybe once every 6 months  . Drug use: No  . Sexual activity: No   Other Topics Concern  . None   Social History Narrative   Marital status: single/widowed.      Children: none      Lives: alone      Employment: unemployed;       Tobacco: none      Alcohol: rare      Exercise:  Walking; going to pool.   Drinks diet pepsi daily and recently will drink coffee every so often    Her Allergies Are:  Allergies  Allergen Reactions  . Dust Mite Mixed Allergen Ext [Mite (D. Farinae)]   . Gabapentin Nausea Only  . Latex Swelling and Other (See Comments)  . Molds & Smuts   :   Her Current Medications Are:  Outpatient Encounter Prescriptions as of 03/24/2017  Medication Sig  . cetirizine (ZYRTEC) 10 MG tablet Take 1 tablet (10 mg total) by mouth daily.  . fluticasone (FLONASE) 50 MCG/ACT nasal spray USE 2 SPRAYS IN BOTH NOSTRILS DAILY  . PARoxetine (PAXIL) 40 MG tablet TAKE 1 TABLET BY MOUTH DAILY FOR DEPRESSION   No facility-administered encounter medications on file as of 03/24/2017.   :  Review of Systems:  Out of a complete 14 point review of systems, all are reviewed and negative with the exception of these symptoms as listed below: Review of Systems  Neurological:       Pt presents today to discuss her sleep study results and treatment options.    Objective:  Neurological Exam  Physical Exam Physical Examination:   Vitals:   03/24/17 1522  BP: (!) 143/69  Pulse: 75    General Examination: The patient is a very pleasant 66 y.o. female in no acute distress. She appears well-developed and well-nourished and well groomed.   HEENT: Normocephalic, atraumatic, pupils are equal, round and reactive to light and accommodation. Extraocular tracking is good without limitation to gaze excursion or nystagmus noted. Normal smooth pursuit is noted. Hearing is grossly intact. Face is symmetric with normal facial animation and normal  facial sensation. Speech is clear with no dysarthria noted. There is no hypophonia. Mildly droopy eyelids. There is no lip, neck/head, jaw or voice tremor. Neck is supple with full range of passive and active motion. There are no carotid bruits on auscultation. Oropharynx exam reveals: mild mouth dryness, adequate dental hygiene and mild airway crowding, due to longer uvula. Mallampati is class II. Tongue protrudes centrally and palate elevates symmetrically. Tonsils are absent. She has a Mild overbite.   Chest: Clear to auscultation without wheezing, rhonchi or crackles noted.  Heart: S1+S2+0, regular and normal without murmurs, rubs or gallops noted.   Abdomen: Soft, non-tender and non-distended with normal bowel sounds appreciated on auscultation.  Extremities: There is no pitting edema in the distal lower extremities  bilaterally. Pedal pulses are intact.  Skin: Warm and dry without trophic changes noted.  Musculoskeletal: exam reveals no obvious joint deformities, tenderness or joint swelling or erythema.   Neurologically:  Mental status: The patient is awake, alert and oriented in all 4 spheres. Her immediate and remote memory, attention, language skills and fund of knowledge are appropriate. There is no evidence of aphasia, agnosia, apraxia or anomia. Speech is clear with normal prosody and enunciation. Thought process is linear. Mood is decreased range and affect is blunted.  Cranial nerves II - XII are as described above under HEENT exam. In addition: shoulder shrug is normal with equal shoulder height noted. Motor exam: Normal bulk, strength and tone is noted. There is no drift, tremor or rebound. Romberg is negative. Reflexes are 2+ throughout. Fine motor skills and coordination: intact with normal finger taps, normal hand movements, normal rapid alternating patting, normal foot taps and normal foot agility.  Cerebellar testing: No dysmetria or intention tremor on finger to nose  testing. Heel to shin is unremarkable bilaterally. There is no truncal or gait ataxia.  Sensory exam: intact to light touch in the upper and lower extremities.  Gait, station and balance: She stands easily. No veering to one side is noted. No leaning to one side is noted. Posture is age-appropriate and stance is narrow based. Gait shows normal stride length and normal pace. No problems turning are noted. Tandem walk is difficult for her.   Assessment and Plan:   In summary, TINAMARIE PRZYBYLSKI is a very pleasant 66 year old female  with an underlying medical history of allergies, depression, diverticulosis, bedwetting, gait disorder and ptosis, and overweight state, whoPresents for follow-up consultation of her sleep disturbance after her recent sleep study. We talked about her test results in detail. She has mild obstructive sleep apnea, more pronounced during supine sleep. She also has mild PLMS but denies restless leg symptoms, did not have any significant PLMS associated arousals and this could be medication related phenomenon from her antidepressant. We can continue to monitor this but for her sleep apnea and especially in light of her history of bedwetting, I suggested she try AutoPap therapy at home. I explained this treatment option to her in detail. If this should fail, we can consider bringing her back for a second sleep study for proper CPAP titration. She is willing to embark on treatment at home at this time. She is advised to make a follow-up appointment in about 10-12 weeks with one of our nurse practitioners for a recheck on her symptoms and compliance download. Her physical exam is stable. I answered all her questions today and she was in agreement.  I spent 25 minutes in total face-to-face time with the patient, more than 50% of which was spent in counseling and coordination of care, reviewing test results, reviewing medication and discussing or reviewing the diagnosis of OSA, its prognosis and  treatment options. Pertinent laboratory and imaging test results that were available during this visit with the patient were reviewed by me and considered in my medical decision making (see chart for details).

## 2017-03-24 NOTE — Patient Instructions (Signed)
We will set you up at home with a so called autoPAP machine for treatment of your overall mild obstructive sleep apnea to see if you feel better, we will do a follow up with NP in about 10 weeks.

## 2017-04-30 ENCOUNTER — Ambulatory Visit (INDEPENDENT_AMBULATORY_CARE_PROVIDER_SITE_OTHER): Payer: PPO | Admitting: Family Medicine

## 2017-04-30 VITALS — BP 138/84 | HR 74 | Temp 98.1°F | Ht 65.0 in | Wt 171.8 lb

## 2017-04-30 DIAGNOSIS — Z23 Encounter for immunization: Secondary | ICD-10-CM

## 2017-04-30 DIAGNOSIS — G4733 Obstructive sleep apnea (adult) (pediatric): Secondary | ICD-10-CM

## 2017-04-30 DIAGNOSIS — E2839 Other primary ovarian failure: Secondary | ICD-10-CM

## 2017-04-30 DIAGNOSIS — F3341 Major depressive disorder, recurrent, in partial remission: Secondary | ICD-10-CM

## 2017-04-30 DIAGNOSIS — G4710 Hypersomnia, unspecified: Secondary | ICD-10-CM | POA: Diagnosis not present

## 2017-04-30 DIAGNOSIS — Z1231 Encounter for screening mammogram for malignant neoplasm of breast: Secondary | ICD-10-CM

## 2017-04-30 DIAGNOSIS — Z1239 Encounter for other screening for malignant neoplasm of breast: Secondary | ICD-10-CM

## 2017-04-30 NOTE — Progress Notes (Signed)
[facility] [address] [place], [place] [address] [phone]  Date:  04/30/2017   Name:  Cathy Gallegos   DOB:  [birth year]   MRN:  [id number]  PCP:  Pearline Cables, MD    Chief Complaint: Follow-up (Pt here to f/u on depression and sleep study. )   History of Present Illness:  Cathy Gallegos is a 66 y.o. very pleasant female patient who presents with the following:  Seen by myself in April with the following HPI:   She was at that time bothered by persistent bedwetting- we had her see urology.  They had her try some sort of medication which helped for a little while but then seemed to stop helping. She has tried cutting out caffeine.  However notes that she continues to empty her bladder in bed on a nightly basis  She also underwent eyelid surgery not long ago- her operation was in November.  She is overall happy with her results but wishes her lids were a little bit higher- however if they brought these up any higher she would not be able to close her eyes. She is able to see more easily  She notes that she feels very tired in the evening, will go to sleep very heavily and does not wake up to urinate during the night. She also feels exhausted and depleted during the day.  Wonders if she is not getting good rest at night  Her last pap was done approx 8 years ago.  Never had an abnl pap.   She is post- menopausal and does not bleed any longer.   She notes some depression due to "my life more than anything," she often dreams about family members who are passed away or not very present in her life.  She has 2 brothers- she is in touch with one of them.  Her other brother does not have contact with her She notes that she is often tired and sleeps a lot.  She tends to do a lot of thritfting.  She works part time; 12- 15 hours.  She has tried to work more but it makes her too tired.  She would like to be more social and  get out more but often feels too tired  She had a sleep study, noted to have mild OSA, they suggested autoPAP therapy at home.  She has not yet started on autopap; she did not get this set up yet due to missed communication with the device company She is still feeling quite tired a lot of the time Pt states that when she called to make her appt she was feeling very depressed (about 2 weeks ago) but she is now feeling better  She is still taking paxil 40- she has been on this for several years She is still feeling tired a lot of the time She is not crying really at this time No SI   She is still wetting her bed at night- however she notes that if she stays at someone else's home she doe snot have this problem. She did see urology, and they gave her some medication to take but it did not help her long term so she stopped using it   Unsure date of last tetanus but thinks it was likely 10 years ago- would like to update today  Patient Active Problem List   Diagnosis Date Noted  . Ptosis 11/09/2015  .  Abnormality of gait 10/16/2015  . Urinary incontinence 10/16/2015    Past Medical History:  Diagnosis Date  . Allergy   . Depression     Past Surgical History:  Procedure Laterality Date  . admission  09/24/2003   diverticulitis.    [name]. BLEPHAROPLASTY    . CESAREAN SECTION    . TONSILLECTOMY      Social History  Substance Use Topics  . Smoking status: Never Smoker  . Smokeless tobacco: Never Used  . Alcohol use 0.0 oz/week     Comment: Maybe once every 6 months    Family History  Problem Relation Age of Onset  . Hypertension Brother   . Throat cancer Father   . Colon cancer Neg Hx   . Esophageal cancer Neg Hx   . Stomach cancer Neg Hx   . Rectal cancer Neg Hx     Allergies  Allergen Reactions  . Dust Mite Mixed Allergen Ext [Mite (D. Farinae)]   . Gabapentin Nausea Only  . Latex Swelling and Other (See Comments)  . Molds & Smuts     Medication list has been reviewed  and updated.  Current Outpatient Prescriptions on File Prior to Visit  Medication Sig Dispense Refill  . cetirizine (ZYRTEC) 10 MG tablet Take 1 tablet (10 mg total) by mouth daily. 90 tablet 3  . fluticasone (FLONASE) 50 MCG/ACT nasal spray USE 2 SPRAYS IN BOTH NOSTRILS DAILY 16 g 9  . PARoxetine (PAXIL) 40 MG tablet TAKE 1 TABLET BY MOUTH DAILY FOR DEPRESSION 90 tablet 3   No current facility-administered medications on file prior to visit.     Review of Systems:  As per HPI- otherwise negative.   Physical Examination: There were no vitals filed for this visit. Vitals:   04/30/17 1219  Weight: 171 lb 12.8 oz (77.9 kg)   Body mass index is 28.59 kg/m. Ideal Body Weight:    GEN: WDWN, NAD, Non-toxic, A & O x 3, overweight, looks well HEENT: Atraumatic, Normocephalic. Neck supple. No masses, No LAD. Ears and Nose: No external deformity. CV: RRR, No M/G/R. No JVD. No thrill. No extra heart sounds. PULM: CTA B, no wheezes, crackles, rhonchi. No retractions. No resp. distress. No accessory muscle use. ABD: S, NT, ND, +BS. No rebound. No HSM. EXTR: No c/c/e NEURO Normal gait.  PSYCH: Normally interactive. Conversant. Not depressed or anxious appearing.  Calm demeanor.    Assessment and Plan: OSA (obstructive sleep apnea)  Immunization due - Plan: Td vaccine greater than or equal to 7yo preservative free IM  Excessive sleepiness  Estrogen deficiency - Plan: DG Bone Density  Screening for breast cancer - Plan: MM DIAG BREAST TOMO BILATERAL  Recurrent major depressive disorder, in partial remission (HCC)  Dx with OSA- she is supposed to get an autopap device but due to communication issues she did not get this device.  Will have her call neurology to get this back in motion.  Explained that untreated sleep apnea is likely why she is feeling tired Will arrange for dexa and mammogram for her Update tetanus which is due Her depression is stable right now and she feels it is  under control She will let me know if any concerns in the this regard  Signed Abbe AmsterdamJessica Tobie Perdue, MD

## 2017-04-30 NOTE — Patient Instructions (Addendum)
It was good to see you again today!  Please call your neurologist and ask for the contact info for AutoPap again- [phone]  We will set you up for a mammogram and bone density test- we will arrange these for you and be in touch with details  You got your tetanus shot today Routine labs and physical are due in April

## 2017-05-01 ENCOUNTER — Telehealth: Payer: Self-pay | Admitting: Neurology

## 2017-05-01 NOTE — Telephone Encounter (Signed)
Pt called the clinic needs to what DME company the order for CPAP was sent to in [place].

## 2017-05-01 NOTE — Telephone Encounter (Signed)
Her order was sent to [facility]. Their number is [phone].   I called pt to discuss. No answer, no VM set up. Will try again later.

## 2017-05-02 NOTE — Telephone Encounter (Signed)
I called pt and advised her [facility] and gave her [facility] phone number. [facility] about one month ago but never returned their call nor wrote down their number. Pt will call them.

## 2017-05-19 ENCOUNTER — Other Ambulatory Visit: Payer: Self-pay | Admitting: Family Medicine

## 2017-05-19 DIAGNOSIS — Z1231 Encounter for screening mammogram for malignant neoplasm of breast: Secondary | ICD-10-CM

## 2017-05-29 ENCOUNTER — Other Ambulatory Visit: Payer: Self-pay | Admitting: Family Medicine

## 2017-05-29 DIAGNOSIS — F32A Depression, unspecified: Secondary | ICD-10-CM

## 2017-05-29 DIAGNOSIS — F329 Major depressive disorder, single episode, unspecified: Secondary | ICD-10-CM

## 2017-06-04 ENCOUNTER — Other Ambulatory Visit: Payer: PPO

## 2017-06-04 ENCOUNTER — Ambulatory Visit: Payer: PPO

## 2017-06-19 ENCOUNTER — Ambulatory Visit
Admission: RE | Admit: 2017-06-19 | Discharge: 2017-06-19 | Disposition: A | Discharge status: Home / Self Care | Payer: PPO | Source: Ambulatory Visit | Attending: Family Medicine | Admitting: Family Medicine

## 2017-06-19 DIAGNOSIS — E2839 Other primary ovarian failure: Secondary | ICD-10-CM

## 2017-06-19 DIAGNOSIS — Z780 Asymptomatic menopausal state: Secondary | ICD-10-CM | POA: Diagnosis not present

## 2017-06-19 DIAGNOSIS — Z1231 Encounter for screening mammogram for malignant neoplasm of breast: Secondary | ICD-10-CM

## 2017-06-19 DIAGNOSIS — M810 Age-related osteoporosis without current pathological fracture: Secondary | ICD-10-CM | POA: Diagnosis not present

## 2017-06-22 ENCOUNTER — Telehealth: Payer: Self-pay | Admitting: Family Medicine

## 2017-06-22 DIAGNOSIS — M810 Age-related osteoporosis without current pathological fracture: Secondary | ICD-10-CM | POA: Insufficient documentation

## 2017-06-22 HISTORY — DX: Age-related osteoporosis without current pathological fracture: M81.0

## 2017-06-22 MED ORDER — ALENDRONATE SODIUM 70 MG PO TABS: 70.0000 mg | ORAL_TABLET | ORAL | 11 refills | Status: DC

## 2017-06-22 NOTE — Telephone Encounter (Signed)
Dg Bone Density  Result Date: 06/19/2017 EXAM: DUAL X-RAY ABSORPTIOMETRY (DXA) FOR BONE MINERAL DENSITY IMPRESSION: Referring Physician:  Pearline Cables PATIENT: Name: Cathy Gallegos, Cathy Gallegos Patient ID: [id number] Birth Date: 15-May-1951 Height: 65.0 in. Sex: Female Measured: 06/19/2017 Weight: 171.8 lbs. Indications: Caucasian, Estrogen Deficient, Family Hist. (Parent hip fracture), Height Loss (781.91), Paxil, Postmenopausal, scoliosis, Secondary Osteoporosis Fractures: Lt forearm Treatments: Calcium (E943.0), Vitamin D (E933.5) ASSESSMENT: The BMD measured at Femur Neck Right is 0.670 g/cm2 with a T-score of -2.6. This patient is considered osteoporotic according to World Health Organization [facility]) criteria. L-3 and L-4 were excluded due to degenerative changes. Patient does not meet criteria for FRAX assessment. Site Region Measured Date Measured Age YA BMD Significant CHANGE T-score DualFemur Neck Right 5/19/[age]    66.8         -2.6    0.670 g/cm2 AP Spine  L1-L2      5/19/2017    66.7         -1.4    1.007 g/cm2 World Health Organization [facility]) criteria for post-menopausal, Caucasian Women: Normal       T-score at or above -1 SD Osteopenia   T-score between -1 and -2.5 SD Osteoporosis T-score at or below -2.5 SD RECOMMENDATION: National Osteoporosis Foundation recommends that FDA-approved medical therapies be considered in postmenopausal women and men age 105 or older with a: 1. Hip or vertebral (clinical or morphometric) fracture. 2. T-score of <-2.5 at the spine or hip. 3. Ten-year fracture probability by FRAX of 3% or greater for hip fracture or 20% or greater for major osteoporotic fracture. All treatment decisions require clinical judgment and consideration of individual patient factors, including patient preferences, co-morbidities, previous drug use, risk factors not captured in the FRAX model (e.g. falls, vitamin D deficiency, increased bone turnover, interval significant decline in bone density) and  possible under - or over-estimation of fracture risk by FRAX. All patients should ensure an adequate intake of dietary calcium (1200 mg/d) and vitamin D (800 IU daily) unless contraindicated. FOLLOW-UP: People with diagnosed cases of osteoporosis or at high risk for fracture should have regular bone mineral density tests. For patients eligible for Medicare, routine testing is allowed once every 2 years. The testing frequency can be increased to one year for patients who have rapidly progressing disease, those who are receiving or discontinuing medical therapy to restore bone mass, or have additional risk factors. I have reviewed this report, and agree with the above findings. [facility] Electronically Signed   By: Charlett Nose M.D.   On: 06/19/2017 15:04   Mm Screening Breast Tomo Bilateral  Result Date: 06/20/2017 CLINICAL DATA:  Screening. EXAM: 2D DIGITAL SCREENING BILATERAL MAMMOGRAM WITH CAD AND ADJUNCT TOMO COMPARISON:  Previous exam(s). ACR Breast Density Category c: The breast tissue is heterogeneously dense, which may obscure small masses. FINDINGS: There are no findings suspicious for malignancy. Images were processed with CAD. IMPRESSION: No mammographic evidence of malignancy. A result letter of this screening mammogram will be mailed directly to the patient. RECOMMENDATION: Screening mammogram in one year. (Code:SM-B-01Y) BI-RADS CATEGORY  1: Negative. Electronically Signed   By: Amie Portland M.D.   On: 06/20/2017 10:30   Called pt- she would like to start treatment for osteoporosis.  Will call in fosamax for her to take weekly.  Mail copy of report to pt

## 2017-06-26 ENCOUNTER — Ambulatory Visit: Payer: PPO | Admitting: Adult Health

## 2017-07-03 DIAGNOSIS — G4733 Obstructive sleep apnea (adult) (pediatric): Secondary | ICD-10-CM | POA: Diagnosis not present

## 2017-07-26 ENCOUNTER — Encounter: Payer: Self-pay | Admitting: Neurology

## 2017-07-29 ENCOUNTER — Encounter: Payer: Self-pay | Admitting: Neurology

## 2017-07-29 ENCOUNTER — Ambulatory Visit (INDEPENDENT_AMBULATORY_CARE_PROVIDER_SITE_OTHER): Payer: PPO | Admitting: Neurology

## 2017-07-29 VITALS — BP 129/68 | HR 68 | Ht 65.0 in | Wt 177.0 lb

## 2017-07-29 DIAGNOSIS — Z9989 Dependence on other enabling machines and devices: Secondary | ICD-10-CM

## 2017-07-29 DIAGNOSIS — G4733 Obstructive sleep apnea (adult) (pediatric): Secondary | ICD-10-CM

## 2017-07-29 NOTE — Patient Instructions (Addendum)
Please continue to try the autoPAP. You have done reasonably well so far.  Keep working on it! While your insurance requires that you use PAP at least 4 hours each night on 70% of the nights, I recommend, that you not skip any nights and use it throughout the night if you can. Getting used to PAP and staying with the treatment long term does take time and patience and discipline. Untreated obstructive sleep apnea when it is moderate to severe can have an adverse impact on cardiovascular health and raise her risk for heart disease, arrhythmias, hypertension, congestive heart failure, stroke and diabetes. Untreated obstructive sleep apnea causes sleep disruption, nonrestorative sleep, and sleep deprivation. This can have an impact on your day to day functioning and cause daytime sleepiness and impairment of cognitive function, memory loss, mood disturbance, and problems focussing. Using PAP regularly can improve these symptoms.  We will have you meet with Robin, our sleep lab manager for a mask fitting and perhaps trying a different mask at home.   Follow-up in 2 months with one of our nurse practitioners.

## 2017-07-29 NOTE — Progress Notes (Signed)
Subjective:    Gallegos ID: Cathy Gallegos is a 66 y.o. female.  HPI     Interim history:  Cathy Gallegos is a 66 year old right-handed woman with an underlying medical history of allergies, depression, diverticulosis, bedwetting, Gait disorder and ptosis (has seen Dr. Krista Blue), and overweight state, who presents for follow-up consultation of her sleep disturbance, after starting AutoPap therapy at home. Cathy Gallegos is unaccompanied today. I last saw her on 03/24/2017, at which time we talked about her sleep study results. She was on treatment for poison ivy. She denied any restless leg symptoms. She had no new symptoms. We talked about her sleep study results from 03/09/2017. Total AHI was 7.8 per hour, supine AHI 38.9 per hour, average oxygen saturation 96% nadir of 89%. She had mild PLMS with minimal arousals. I suggested trial of AutoPap therapy at home.   Today, 07/29/2017 (all dictated new, as well as above notes, some dictation done in note pad or Word, outside of chart, may appear as copied):  I reviewed her AutoPap compliance data from 06/27/2017 through 07/26/2017 which is a total of 30 days, during which time she used her AutoPap 26 days with percent used days greater than 4 hours at 67%, indicating suboptimal compliance with an average usage for days on treatment of 5 hours and 52 minutes, residual AHI borderline at 4.9 per hour, 95th percentile of pressure at 11.5 cm, leak high with Cathy 95th percentile at 42 L/m on a pressure range of 5-12. She reports having difficulty tolerating AutoPap. She is not sure that it is helpful. She often takes Cathy mask off in Cathy middle of Cathy night and does not realize it. Sometimes she falls asleep without putting Cathy AutoPap on. She lives alone and sometimes she is anxious. She is willing to continue to try it. She has been using a fullface mask which she then changed to a different kind with or patting. She would like to be able to try a nasal mask or even nasal  pillows. Unfortunately, her bedwetting is about Cathy same. She had tried medication through urology and had diagnostic testing through her urologist but to no avail. She stopped taking Cathy medication. She is supposed to start Fosamax for osteoporosis but has not started it yet.  Cathy Gallegos's allergies, current medications, family history, past medical history, past social history, past surgical history and problem list were reviewed and updated as appropriate.    Previously (copied from previous notes for reference):   I first met her on 01/08/2017 at Cathy request of her primary care physician, at which time she complained of excessive daytime somnolence and bedwetting. I invited her for a sleep study. She had a baseline sleep study on 03/09/2017. I went over her test results with her in detail today. Sleep efficiency was 86.6%, sleep latency prolonged at 41.5 minutes and REM latency was delayed at 196 minutes. She had 40.5 minutes of wake after sleep onset with mild to moderate sleep fragmentation. She had a markedly increased percentage of stage II sleep and a decreased percentage of slow-wave sleep and a markedly decreased percentage of REM sleep. She had mild intermittent snoring. Total AHI was 7.8 per hour, rising to 38.9 per hour during supine sleep. Average oxygen saturation was 96%, nadir was 89%. She had mild PLMS with an index of 17.1 and minimal arousals associated with PLMS.   01/08/17: She reports excessive daytime somnolence. I reviewed your office note from 01/02/2017. She reports sleeping very deeply. She  has had nighttime urinary incontinence and bedwetting for Cathy past year or so. Her Epworth sleepiness score is 10 out of 24, fatigue score is 56 out of 63. She is unsure if she snores. Denies RLS symptoms, has some pain R leg.  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  She has a variable sleep and wake schedule. She lives alone. She is divorced. She has no children. She has 2  cats. She has a TV in her bedroom which tends to stay on at night. She endorses residual depression, daytime tiredness, nonrestorative sleep, has had rare morning headaches. For Cathy past 6-12 months she has had more vivid dreams, has had bedwetting for Cathy past 12+ months. She is no longer on any medication for this, tried something through urology. She has no family history of OSA. She has a history of sleep talking. She has reduced her caffeine intake per urology recommendation. She is a nonsmoker and does not drink alcohol on a regular basis.  Her Past Medical History Is Significant For: Past Medical History:  Diagnosis Date  . Allergy   . Depression     Her Past Surgical History Is Significant For: Past Surgical History:  Procedure Laterality Date  . admission  09/24/2003   diverticulitis.    [name] BLEPHAROPLASTY    . CESAREAN SECTION    . TONSILLECTOMY      Her Family History Is Significant For: Family History  Problem Relation Age of Onset  . Hypertension Brother   . Throat cancer Father   . Breast cancer Maternal Aunt   . Colon cancer Neg Hx   . Esophageal cancer Neg Hx   . Stomach cancer Neg Hx   . Rectal cancer Neg Hx     Her Social History Is Significant For: Social History   Socioeconomic History  . Marital status: Divorced    Spouse name: None  . Number of children: 0  . Years of education: BA   . Highest education level: None  Social Needs  . Financial resource strain: None  . Food insecurity - worry: None  . Food insecurity - inability: None  . Transportation needs - medical: None  . Transportation needs - non-medical: None  Occupational History  . Occupation: N/A  Tobacco Use  . Smoking status: Never Smoker  . Smokeless tobacco: Never Used  Substance and Sexual Activity  . Alcohol use: Yes    Alcohol/week: 0.0 oz    Comment: Maybe once every 6 months  . Drug use: No  . Sexual activity: No  Other Topics Concern  . None  Social History Narrative    Marital status: single/widowed.      Children: none      Lives: alone      Employment: unemployed;       Tobacco: none      Alcohol: rare      Exercise:  Walking; going to pool.   Drinks diet pepsi daily and recently will drink coffee every so often    Her Allergies Are:  Allergies  Allergen Reactions  . Dust Mite Mixed Allergen Ext [Mite (D. Farinae)]   . Gabapentin Nausea Only  . Latex Swelling and Other (See Comments)  . Molds & Smuts   :   Her Current Medications Are:  Outpatient Encounter Medications as of 07/29/2017  Medication Sig  . cetirizine (ZYRTEC) 10 MG tablet Take 1 tablet (10 mg total) by mouth daily.  . fluticasone (FLONASE) 50 MCG/ACT nasal  spray USE 2 SPRAYS IN BOTH NOSTRILS DAILY  . PARoxetine (PAXIL) 40 MG tablet Take 1 tablet (40 mg total) by mouth daily.  [name] alendronate (FOSAMAX) 70 MG tablet Take 1 tablet (70 mg total) by mouth every 7 (seven) days. Take with a full glass of water on an empty stomach. (Gallegos not taking: Reported on 07/29/2017)   No facility-administered encounter medications on file as of 07/29/2017.   :  Review of Systems:  Out of a complete 14 point review of systems, all are reviewed and negative with Cathy exception of these symptoms as listed below: Review of Systems  Neurological:       Gallegos reports that she is having a lot of trouble with Cathy machine and isn't sure that it is helping her.     Objective:  Neurological Exam  Physical Exam Physical Examination:   Vitals:   07/29/17 1042  BP: 129/68  Pulse: 68    General Examination: Cathy Gallegos is a very pleasant 66 y.o. female in no acute distress. She appears well-developed and well-nourished and well groomed. Mildly anxious.   HEENT:Normocephalic, atraumatic, pupils are equal, round and reactive to light and accommodation. Extraocular tracking is good without limitation to gaze excursion or nystagmus noted. Normal smooth pursuit is noted. Hearing is grossly intact. Face  is symmetric with normal facial animation and normal facial sensation. Speech is clear with no dysarthria noted. There is no hypophonia. Mildly droopy eyelids, stable. There is no lip, neck/head, jaw or voice tremor. Neck is supple with full range of passive and active motion. There are no carotid bruits on auscultation. Oropharynx exam reveals: mildmouth dryness, adequatedental hygiene and mildairway crowding. Tongue protrudes centrally and palate elevates symmetrically. Tonsils are absent.   Chest:Clear to auscultation without wheezing, rhonchi or crackles noted.  Heart:S1+S2+0, regular and normal without murmurs, rubs or gallops noted.   Abdomen:Soft, non-tender and non-distended with normal bowel sounds appreciated on auscultation.  Extremities:There is nopitting edema in Cathy distal lower extremities bilaterally. Pedal pulses are intact.  Skin: Warm and dry without trophic changes noted.  Musculoskeletal: exam reveals no obvious joint deformities, tenderness or joint swelling or erythema.   Neurologically:  Mental status: Cathy Gallegos is awake, alert and oriented in all 4 spheres. Herimmediate and remote memory, attention, language skills and fund of knowledge are appropriate. There is no evidence of aphasia, agnosia, apraxia or anomia. Speech is clear with normal prosody and enunciation. Thought process is linear. Mood is decreased rangeand affect is blunted.  Cranial nerves II - XII are as described above under HEENT exam.  Motor exam: Normal bulk, strength and tone is noted. There is no drift, tremor or rebound. Romberg is negative. Reflexes are 2+ throughout. Fine motor skills and coordination: intact with normal finger taps, normal hand movements, normal rapid alternating patting, normal foot taps and normal foot agility.  Cerebellar testing: No dysmetria or intention tremor on finger to nose testing. Heel to shin is unremarkable bilaterally. There is no truncal or gait  ataxia.  Sensory exam: intact to light touch in Cathy upper and lower extremities.  Gait, station and balance: Shestands easily. No veering to one side is noted. No leaning to one side is noted. Posture is age-appropriate and stance is narrow based. Gait shows normalstride length and normalpace. Tandem walk is difficult for her.   Assessment and Plan:   In summary, SHONTA BOURQUE a very pleasant 66 year old female with an underlying medical history of allergies, depression, diverticulosis, bedwetting, gait  disorder and ptosis, and overweight state, who presents for follow-up consultation of her mild obstructive sleep apnea, after starting AutoPap therapy. We talked about her sleep study results in detail last time. She is struggling with Cathy mask and has not been able to sleep through Cathy night consistently. She is commended for trying. She is not quite 70% compliant with her therapy but is willing to continue. I suggested she meet with our sleep lab Cathy image her for troubleshooting her mask issues. I suggested a two-month follow-up with one of our nurse practitioners for compliance recheck and symptom recheck. She is encouraged to talk to her urologist again about her bedwetting issues. Physical exam is stable. I answered all her questions today and she was in agreement with Cathy plan.  I spent 25 minutes in total face-to-face time with Cathy Gallegos, more than 50% of which was spent in counseling and coordination of care, reviewing test results, reviewing medication and discussing or reviewing Cathy diagnosis of OSA, its prognosis and treatment options. Pertinent laboratory and imaging test results that were available during this visit with Cathy Gallegos were reviewed by me and considered in my medical decision making (see chart for details).

## 2017-08-03 DIAGNOSIS — G4733 Obstructive sleep apnea (adult) (pediatric): Secondary | ICD-10-CM | POA: Diagnosis not present

## 2017-09-02 DIAGNOSIS — G4733 Obstructive sleep apnea (adult) (pediatric): Secondary | ICD-10-CM | POA: Diagnosis not present

## 2017-09-24 ENCOUNTER — Other Ambulatory Visit: Payer: Self-pay | Admitting: Emergency Medicine

## 2017-09-24 DIAGNOSIS — J302 Other seasonal allergic rhinitis: Secondary | ICD-10-CM

## 2017-09-24 MED ORDER — CETIRIZINE HCL 10 MG PO TABS: 10.0000 mg | ORAL_TABLET | Freq: Every day | ORAL | 3 refills | Status: DC

## 2017-10-03 DIAGNOSIS — G4733 Obstructive sleep apnea (adult) (pediatric): Secondary | ICD-10-CM | POA: Diagnosis not present

## 2017-10-15 NOTE — Progress Notes (Addendum)
[facility]  PATIENT: Cathy Gallegos DOB: [birth year]   REASON FOR VISIT: Follow-up for obstructive sleep apnea on CPAP HISTORY FROM: Patient   HISTORY OF PRESENT ILLNESS: Cathy Gallegos is a 67 year old right-handed woman with an underlying medical history of allergies, depression, diverticulosis, bedwetting, Gait disorder and ptosis (has seen Dr. Terrace ArabiaYan), and overweight state, who presents for follow-up consultation of her sleep disturbance, after starting AutoPap therapy at home. The patient is unaccompanied today. I last saw her on 03/24/2017, at which time we talked about her sleep study results. She was on treatment for poison ivy. She denied any restless leg symptoms. She had no new symptoms. We talked about her sleep study results from 03/09/2017. Total AHI was 7.8 per hour, supine AHI 38.9 per hour, average oxygen saturation 96% nadir of 89%. She had mild PLMS with minimal arousals. I suggested trial of AutoPap therapy at home.   Today,07/29/2017 (all dictated new, as well as above notes, some dictation done in note pad or Word, outside of chart, may appear as copied): I reviewed her AutoPap compliance data from 06/27/2017 through 07/26/2017 which is a total of 30 days, during which time she used her AutoPap 26 days with percent used days greater than 4 hours at 67%, indicating suboptimal compliance with an average usage for days on treatment of 5 hours and 52 minutes, residual AHI borderline at 4.9 per hour, 95th percentile of pressure at 11.5 Cathy, leak high with the 95th percentile at 42 L/m on a pressure range of 5-12. She reportshaving difficulty tolerating AutoPap. She is not sure that it is helpful. She often takes the mask off in the middle of the night and does not realize it. Sometimes she falls asleep without putting the AutoPap on. She lives alone and sometimes she is anxious. She is willing to continue to try it. She has been using a fullface mask which she then  changed to a different kind with or patting. She would like to be able to try a nasal mask or even nasal pillows. Unfortunately, her bedwetting is about the same. She had tried medication through urology and had diagnostic testing through her urologist but to no avail. She stopped taking the medication. She is supposed to start Fosamax for osteoporosis but has not started it yet. UPDATE 1/24/2019CM Cathy Gallegos, 67 year old female returns for follow-up with history of obstructive sleep apnea here for CPAP compliance.  Compliance data dated 07/18/2017 to 10/15/2017 shows compliance greater than 4 hours at 48% which is suboptimal.  Average usage 5 hours 10 minutes.  Set pressure 5-10 Cathy AHI 7 leak high 35 percentile at 32.7%.  She claims she sometimes takes her mask off in the middle of the night.  She also says sometimes she falls asleep before she puts her mask on.  She claims she had a 3-week illness that caused her not to use her mask at all .  She also complains with her mask fit .  She has tried nasal pillows in the past and did not like them.  She returns for reevaluation  .  REVIEW OF SYSTEMS: Full 14 system review of systems performed and notable only for those listed, all others are neg:  Constitutional: neg  Cardiovascular: neg Ear/Nose/Throat: neg  Skin: neg Eyes: neg Respiratory: neg Gastroitestinal: neg  Hematology/Lymphatic: neg  Endocrine: neg Musculoskeletal:neg Allergy/Immunology: neg Neurological: neg Psychiatric: neg Sleep : Obstructive sleep apnea with CPAP   ALLERGIES: Allergies  Allergen Reactions  . Dust Mite  Mixed Allergen Ext [Mite (D. Farinae)]   . Gabapentin Nausea Only  . Latex Swelling and Other (See Comments)  . Molds & Smuts     HOME MEDICATIONS: Outpatient Medications Prior to Visit  Medication Sig Dispense Refill  . alendronate (FOSAMAX) 70 MG tablet Take 1 tablet (70 mg total) by mouth every 7 (seven) days. Take with a full glass of water on an empty  stomach. 4 tablet 11  . cetirizine (ZYRTEC) 10 MG tablet Take 1 tablet (10 mg total) by mouth daily. 90 tablet 3  . fluticasone (FLONASE) 50 MCG/ACT nasal spray USE 2 SPRAYS IN BOTH NOSTRILS DAILY 16 g 9  . PARoxetine (PAXIL) 40 MG tablet Take 1 tablet (40 mg total) by mouth daily. 90 tablet 3   No facility-administered medications prior to visit.     PAST MEDICAL HISTORY: Past Medical History:  Diagnosis Date  . Allergy   . Depression   . OSA on CPAP     PAST SURGICAL HISTORY: Past Surgical History:  Procedure Laterality Date  . admission  09/24/2003   diverticulitis.    [name] BLEPHAROPLASTY    . CESAREAN SECTION    . TONSILLECTOMY      FAMILY HISTORY: Family History  Problem Relation Age of Onset  . Hypertension Brother   . Throat cancer Father   . Breast cancer Maternal Aunt   . Colon cancer Neg Hx   . Esophageal cancer Neg Hx   . Stomach cancer Neg Hx   . Rectal cancer Neg Hx     SOCIAL HISTORY: Social History   Socioeconomic History  . Marital status: Divorced    Spouse name: Not on file  . Number of children: 0  . Years of education: BA   . Highest education level: Not on file  Social Needs  . Financial resource strain: Not on file  . Food insecurity - worry: Not on file  . Food insecurity - inability: Not on file  . Transportation needs - medical: Not on file  . Transportation needs - non-medical: Not on file  Occupational History  . Occupation: N/A  Tobacco Use  . Smoking status: Never Smoker  . Smokeless tobacco: Never Used  Substance and Sexual Activity  . Alcohol use: Yes    Alcohol/week: 0.0 oz    Comment: Maybe once every 6 months  . Drug use: No  . Sexual activity: No  Other Topics Concern  . Not on file  Social History Narrative   Marital status: single/widowed.      Children: none      Lives: alone      Employment: unemployed;       Tobacco: none      Alcohol: rare      Exercise:  Walking; going to pool.   Drinks diet pepsi daily and  recently will drink coffee every so often     PHYSICAL EXAM  Vitals:   10/16/17 1040  BP: 123/70  Pulse: 86  Weight: 177 lb 12.8 oz (80.6 kg)   Body mass index is 29.59 kg/m.  Generalized: Well developed, obese female in no acute distress  Head: normocephalic and atraumatic,. Oropharynx benign  Neck: Supple,  Musculoskeletal: No deformity   Neurological examination   Mentation: Alert oriented to time, place, history taking. Attention span and concentration appropriate. Recent and remote memory intact.  Follows all commands speech and language fluent.   Cranial nerve II-XII: Pupils were equal round reactive to light extraocular movements were full, visual  field were full on confrontational test. Facial sensation and strength were normal. hearing was intact to finger rubbing bilaterally. Uvula tongue midline. head turning and shoulder shrug were normal and symmetric.Tongue protrusion into cheek strength was normal. Motor: normal bulk and tone, full strength in the BUE, BLE, fine finger movements normal, no pronator drift. No focal weakness Sensory: normal and symmetric to light touch, pinprick,  Coordination: finger-nose-finger, heel-to-shin bilaterally, no dysmetria Gait and Station: Rising up from seated position without assistance, normal stance,  moderate stride, good arm swing, smooth turning, able to perform tiptoe, and heel walking without difficulty. Tandem gait is steady  DIAGNOSTIC DATA (LABS, IMAGING, TESTING) - I reviewed patient records, labs, notes, testing and imaging myself where available.  Lab Results  Component Value Date   WBC 8.1 01/02/2017   HGB 13.6 01/02/2017   HCT 42.3 01/02/2017   MCV 82.8 01/02/2017   PLT 253.0 01/02/2017      Component Value Date/Time   NA 139 01/02/2017 1214   NA 141 11/09/2015 1359   K 3.7 01/02/2017 1214   CL 104 01/02/2017 1214   CO2 30 01/02/2017 1214   GLUCOSE 88 01/02/2017 1214   BUN 19 01/02/2017 1214   BUN 15  11/09/2015 1359   CREATININE 0.80 01/02/2017 1214   CREATININE 0.78 06/16/2015 1005   CALCIUM 9.5 01/02/2017 1214   PROT 6.8 01/02/2017 1214   PROT 6.3 11/09/2015 1359   ALBUMIN 4.0 01/02/2017 1214   ALBUMIN 4.1 11/09/2015 1359   AST 14 01/02/2017 1214   ALT 10 01/02/2017 1214   ALKPHOS 94 01/02/2017 1214   BILITOT 0.3 01/02/2017 1214   BILITOT <0.2 11/09/2015 1359   GFRNONAA 84 11/09/2015 1359   GFRNONAA 81 06/16/2015 1005   GFRAA 97 11/09/2015 1359   GFRAA >89 06/16/2015 1005   Lab Results  Component Value Date   CHOL 191 01/02/2017   HDL 52.40 01/02/2017   LDLCALC 120 (H) 01/02/2017   TRIG 93.0 01/02/2017   CHOLHDL 4 01/02/2017   Lab Results  Component Value Date   HGBA1C 5.6 10/24/2016   Lab Results  Component Value Date   VITAMINB12 428 11/09/2015   Lab Results  Component Value Date   TSH 3.23 01/02/2017      ASSESSMENT AND PLAN   67 year old female with an underlying medical history of allergies, depression, diverticulosis, bedwetting, gait disorder and ptosis, and overweight state, who presents for follow-up consultation of her mild obstructive sleep apnea, after starting AutoPap therapy.  She is struggling with the mask and has not been able to sleep through the night consistently. She is commended for trying. She is 48% compliance  with her therapy but is willing to continue. I suggested she meet with our sleep lab  Robin  for troubleshooting her mask issues.   PLAN: CPAP compliance 48% greater than 4 hours.  Patient has been sick for about 3 weeks Check by the sleep lab troubleshooting mask issues with Robin Follow-up in 3 months repeat compliance Nilda Riggs, [facility], [facility], APRN  [facility] [address], [address] [place], [place] [address] [phone]  I reviewed the above note and documentation by the Nurse Practitioner and agree with the history, physical exam, assessment and plan as outlined above. I was immediately  available for face-to-face consultation. Huston Foley, MD, PhD [facility])

## 2017-10-16 ENCOUNTER — Encounter (INDEPENDENT_AMBULATORY_CARE_PROVIDER_SITE_OTHER): Payer: Self-pay

## 2017-10-16 ENCOUNTER — Ambulatory Visit (INDEPENDENT_AMBULATORY_CARE_PROVIDER_SITE_OTHER): Payer: PPO | Admitting: Nurse Practitioner

## 2017-10-16 ENCOUNTER — Encounter: Payer: Self-pay | Admitting: Nurse Practitioner

## 2017-10-16 DIAGNOSIS — G4733 Obstructive sleep apnea (adult) (pediatric): Secondary | ICD-10-CM | POA: Diagnosis not present

## 2017-10-16 DIAGNOSIS — Z9989 Dependence on other enabling machines and devices: Secondary | ICD-10-CM | POA: Diagnosis not present

## 2017-10-16 DIAGNOSIS — S060XAA Concussion with loss of consciousness status unknown, initial encounter: Secondary | ICD-10-CM | POA: Insufficient documentation

## 2017-10-16 DIAGNOSIS — S060X9A Concussion with loss of consciousness of unspecified duration, initial encounter: Secondary | ICD-10-CM | POA: Insufficient documentation

## 2017-10-16 HISTORY — DX: Obstructive sleep apnea (adult) (pediatric): G47.33

## 2017-10-16 HISTORY — DX: Concussion with loss of consciousness status unknown, initial encounter: S06.0XAA

## 2017-10-16 HISTORY — DX: Concussion with loss of consciousness of unspecified duration, initial encounter: S06.0X9A

## 2017-10-16 NOTE — Progress Notes (Signed)
Received call from [facility] due to indication of patient's noncompliance with cpap   They had compliance report from Oct 2018. This RN advised him that the patient reported today she was unable to use it for approximately 3 weeks due to having the flu. Brynda GreathouseRandall stated he would disregard the claim he now has and call [facility] to request they submit a new claim in 30 days with hope that the patient would have been able to be compliant with CPAP use.

## 2017-10-16 NOTE — Patient Instructions (Signed)
CPAP compliance 48% greater than 4 hours.  Patient has been sick for about 3 weeks Check by the sleep lab troubleshooting mask issues Follow-up in 3 months repeat compliance

## 2017-10-17 ENCOUNTER — Emergency Department (HOSPITAL_BASED_OUTPATIENT_CLINIC_OR_DEPARTMENT_OTHER)
Admission: EM | Admit: 2017-10-17 | Discharge: 2017-10-17 | Disposition: A | Discharge status: Home / Self Care | Payer: No Typology Code available for payment source | Attending: Emergency Medicine | Admitting: Emergency Medicine

## 2017-10-17 ENCOUNTER — Other Ambulatory Visit: Payer: Self-pay

## 2017-10-17 ENCOUNTER — Encounter (HOSPITAL_BASED_OUTPATIENT_CLINIC_OR_DEPARTMENT_OTHER): Payer: Self-pay | Admitting: Adult Health

## 2017-10-17 ENCOUNTER — Emergency Department (HOSPITAL_BASED_OUTPATIENT_CLINIC_OR_DEPARTMENT_OTHER): Payer: No Typology Code available for payment source

## 2017-10-17 DIAGNOSIS — Z91040 Latex allergy status: Secondary | ICD-10-CM | POA: Insufficient documentation

## 2017-10-17 DIAGNOSIS — Y92410 Unspecified street and highway as the place of occurrence of the external cause: Secondary | ICD-10-CM | POA: Diagnosis not present

## 2017-10-17 DIAGNOSIS — M542 Cervicalgia: Secondary | ICD-10-CM | POA: Diagnosis not present

## 2017-10-17 DIAGNOSIS — S161XXA Strain of muscle, fascia and tendon at neck level, initial encounter: Secondary | ICD-10-CM | POA: Diagnosis not present

## 2017-10-17 DIAGNOSIS — S0990XA Unspecified injury of head, initial encounter: Secondary | ICD-10-CM | POA: Diagnosis not present

## 2017-10-17 DIAGNOSIS — Y9389 Activity, other specified: Secondary | ICD-10-CM | POA: Diagnosis not present

## 2017-10-17 DIAGNOSIS — S199XXA Unspecified injury of neck, initial encounter: Secondary | ICD-10-CM | POA: Diagnosis not present

## 2017-10-17 DIAGNOSIS — Y998 Other external cause status: Secondary | ICD-10-CM | POA: Diagnosis not present

## 2017-10-17 DIAGNOSIS — Z79899 Other long term (current) drug therapy: Secondary | ICD-10-CM | POA: Diagnosis not present

## 2017-10-17 DIAGNOSIS — F329 Major depressive disorder, single episode, unspecified: Secondary | ICD-10-CM | POA: Diagnosis not present

## 2017-10-17 DIAGNOSIS — R51 Headache: Secondary | ICD-10-CM | POA: Diagnosis not present

## 2017-10-17 MED ORDER — ORPHENADRINE CITRATE ER 100 MG PO TB12: 100.0000 mg | ORAL_TABLET | Freq: Two times a day (BID) | ORAL | 0 refills | Status: DC

## 2017-10-17 MED ORDER — IBUPROFEN 600 MG PO TABS: 600.0000 mg | ORAL_TABLET | Freq: Four times a day (QID) | ORAL | 0 refills | Status: DC | PRN
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
## 2017-10-17 NOTE — ED Notes (Signed)
Pt discharged to home with family, NAD.  

## 2017-10-17 NOTE — ED Notes (Signed)
ED Provider at bedside. 
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
## 2017-10-17 NOTE — ED Notes (Signed)
Family came out to desk stated if doctor does not come to room soon to give her results that she is leaving.

## 2017-10-20 ENCOUNTER — Ambulatory Visit (INDEPENDENT_AMBULATORY_CARE_PROVIDER_SITE_OTHER): Payer: PPO | Admitting: Family Medicine

## 2017-10-20 ENCOUNTER — Encounter: Payer: Self-pay | Admitting: Family Medicine

## 2017-10-20 ENCOUNTER — Telehealth: Payer: Self-pay | Admitting: Emergency Medicine

## 2017-10-20 VITALS — BP 122/82 | HR 83 | Temp 97.6°F | Ht 65.0 in | Wt 181.0 lb

## 2017-10-20 DIAGNOSIS — S060X0A Concussion without loss of consciousness, initial encounter: Secondary | ICD-10-CM

## 2017-10-20 NOTE — Patient Instructions (Addendum)
It looks like you have a concussion Please try to rest as much as you can- no reading, no exercise, avoid looking at screens Nap as much as you can, and you can use the medications you got from the ER for your symptoms  Let's see you in one week-
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
## 2017-10-27 ENCOUNTER — Encounter: Payer: Self-pay | Admitting: Family Medicine

## 2017-10-27 ENCOUNTER — Ambulatory Visit (INDEPENDENT_AMBULATORY_CARE_PROVIDER_SITE_OTHER): Payer: PPO | Admitting: Family Medicine

## 2017-10-27 VITALS — BP 132/82 | HR 77 | Temp 98.3°F | Ht 65.0 in | Wt 181.0 lb

## 2017-10-27 DIAGNOSIS — S060X0A Concussion without loss of consciousness, initial encounter: Secondary | ICD-10-CM

## 2017-10-27 NOTE — Patient Instructions (Signed)
Good to see you today- I am glad that you are getting better!  Continue to rest and take it easy for now- please see me again if you do not continue to improve and get back to normal over the next week or so.     Please see me in late April/ May for a physical and labs
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
## 2017-11-03 ENCOUNTER — Encounter: Payer: Self-pay | Admitting: Family Medicine

## 2017-11-03 ENCOUNTER — Ambulatory Visit (INDEPENDENT_AMBULATORY_CARE_PROVIDER_SITE_OTHER): Payer: PPO | Admitting: Family Medicine

## 2017-11-03 VITALS — BP 130/82 | HR 87 | Temp 98.1°F | Ht 65.0 in | Wt 180.6 lb

## 2017-11-03 DIAGNOSIS — S060X0A Concussion without loss of consciousness, initial encounter: Secondary | ICD-10-CM

## 2017-11-03 DIAGNOSIS — G4733 Obstructive sleep apnea (adult) (pediatric): Secondary | ICD-10-CM | POA: Diagnosis not present

## 2017-11-03 NOTE — Patient Instructions (Signed)
Please plan to see me in about 2 weeks. However, if you are not making progress over the rest of this week please let me know and I will refer you to neurology

## 2017-11-25 ENCOUNTER — Telehealth: Payer: Self-pay

## 2017-11-25 DIAGNOSIS — S060X0D Concussion without loss of consciousness, subsequent encounter: Secondary | ICD-10-CM

## 2017-11-25 NOTE — Telephone Encounter (Signed)
Copied from CRM [id number]. Topic: Inquiry >> Nov 12, 2017  6:17 PM Alexander BergeronBarksdale, Harvey B wrote: Reason for CRM: pt called to get the referral for the neurologist, contact pt to advise if needed  >> Nov 13, 2017  9:16 AM Desmond DikeKnight, Waynetta H, CMA wrote: [facility]. Pt sees Dr Shanda BumpsJessica Copland, not Dr Karleen HampshireSpencer Copland >> Nov 25, 2017 11:59 AM Diana EvesHoyt, Maryann B wrote: Pt would like to move forward with referral to neurologist

## 2017-11-25 NOTE — Telephone Encounter (Signed)
Pt requesting neuro referral

## 2017-11-26 NOTE — Telephone Encounter (Signed)
Taken care of

## 2017-12-01 DIAGNOSIS — G4733 Obstructive sleep apnea (adult) (pediatric): Secondary | ICD-10-CM | POA: Diagnosis not present

## 2017-12-03 ENCOUNTER — Encounter: Payer: Self-pay | Admitting: Neurology

## 2017-12-03 ENCOUNTER — Ambulatory Visit: Payer: Self-pay | Admitting: Neurology

## 2017-12-03 VITALS — BP 132/76 | HR 92 | Ht 65.0 in | Wt 180.0 lb

## 2017-12-03 DIAGNOSIS — R519 Headache, unspecified: Secondary | ICD-10-CM

## 2017-12-03 DIAGNOSIS — R51 Headache: Secondary | ICD-10-CM

## 2017-12-03 HISTORY — DX: Headache, unspecified: R51.9

## 2017-12-03 MED ORDER — NORTRIPTYLINE HCL 10 MG PO CAPS: 20.0000 mg | ORAL_CAPSULE | Freq: Every day | ORAL | 11 refills | Status: DC
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
## 2017-12-09 DIAGNOSIS — G4733 Obstructive sleep apnea (adult) (pediatric): Secondary | ICD-10-CM | POA: Diagnosis not present

## 2018-01-01 DIAGNOSIS — G4733 Obstructive sleep apnea (adult) (pediatric): Secondary | ICD-10-CM | POA: Diagnosis not present

## 2018-01-06 ENCOUNTER — Other Ambulatory Visit: Payer: PPO

## 2018-01-11 ENCOUNTER — Encounter: Payer: Self-pay | Admitting: Nurse Practitioner

## 2018-01-13 NOTE — Progress Notes (Addendum)
[facility]  PATIENT: Cathy Gallegos DOB: 11-10-1950   REASON FOR VISIT: Follow-up for obstructive sleep apnea on CPAP HISTORY FROM: Patient   HISTORY OF PRESENT ILLNESS: Cathy Gallegos is a 67 year old right-handed woman with an underlying medical history of allergies, depression, diverticulosis, bedwetting, Gait disorder and ptosis (has seen Dr. Terrace Arabia), and overweight state, who presents for follow-up consultation of her sleep disturbance, after starting AutoPap therapy at home. The patient is unaccompanied today. I last saw her on 03/24/2017, at which time we talked about her sleep study results. She was on treatment for poison ivy. She denied any restless leg symptoms. She had no new symptoms. We talked about her sleep study results from 03/09/2017. Total AHI was 7.8 per hour, supine AHI 38.9 per hour, average oxygen saturation 96% nadir of 89%. She had mild PLMS with minimal arousals. I suggested trial of AutoPap therapy at home.   Today,07/29/2017 (all dictated new, as well as above notes, some dictation done in note pad or Word, outside of chart, may appear as copied): I reviewed her AutoPap compliance data from 06/27/2017 through 07/26/2017 which is a total of 30 days, during which time she used her AutoPap 26 days with percent used days greater than 4 hours at 67%, indicating suboptimal compliance with an average usage for days on treatment of 5 hours and 52 minutes, residual AHI borderline at 4.9 per hour, 95th percentile of pressure at 11.5 cm, leak high with the 95th percentile at 42 L/m on a pressure range of 5-12. She reportshaving difficulty tolerating AutoPap. She is not sure that it is helpful. She often takes the mask off in the middle of the night and does not realize it. Sometimes she falls asleep without putting the AutoPap on. She lives alone and sometimes she is anxious. She is willing to continue to try it. She has been using a fullface mask which she then  changed to a different kind with or patting. She would like to be able to try a nasal mask or even nasal pillows. Unfortunately, her bedwetting is about the same. She had tried medication through urology and had diagnostic testing through her urologist but to no avail. She stopped taking the medication. She is supposed to start Fosamax for osteoporosis but has not started it yet. UPDATE 1/24/2019CM Cathy Gallegos, 67 year old female returns for follow-up with history of obstructive sleep apnea here for CPAP compliance.  Compliance data dated 07/18/2017 to 10/15/2017 shows at 70% at 48% which is suboptimal.  Average usage 5 hours 10 minutes.  Set pressure 5-10 cm AHI 7 leak high 35 percentile at 32.7%.  She claims she sometimes takes her mask off in the middle of the night.  She also says sometimes she falls asleep before she puts her mask on.  She claims she had a 3-week illness that caused her not to use her mask at all .  She also complains with her mask fit .  She has tried nasal pillows in the past and did not like them.  She returns for reevaluation UPDATE 4/24/2019CM Cathy Gallegos, 67 year old female returns for follow-up.  She has a history of obstructive sleep apnea here for CPAP compliance.  Data dated 12/13/2017-01/11/2018 shows compliance greater than 4 hours at 70%.  Average usage 5 hours 57 minutes.  Set pressure 5 to 12 cm.  Max pressure 11.7 EPR level 1 AHI 10.16.  She denies having any problems with her mask.  She sleeps through the night.  She  does not always put her mask when she gets in bed at night and was encouraged to do so.She returns for reevaluation. Cathy Gallegos  REVIEW OF SYSTEMS: Full 14 system review of systems performed and notable only for those listed, all others are neg:  Constitutional: neg  Cardiovascular: neg Ear/Nose/Throat: neg  Skin: neg Eyes: neg Respiratory: neg Gastroitestinal: neg  Hematology/Lymphatic: neg  Endocrine: neg Musculoskeletal:neg Allergy/Immunology: neg Neurological:  neg Psychiatric: neg Sleep : Obstructive sleep apnea with CPAP   ALLERGIES: Allergies  Allergen Reactions  . Dust Mite Mixed Allergen Ext [Mite (D. Farinae)]   . Gabapentin Nausea Only  . Latex Swelling and Other (See Comments)  . Molds & Smuts     HOME MEDICATIONS: Outpatient Medications Prior to Visit  Medication Sig Dispense Refill  . alendronate (FOSAMAX) 70 MG tablet Take 1 tablet (70 mg total) by mouth every 7 (seven) days. Take with a full glass of water on an empty stomach. 4 tablet 11  . cetirizine (ZYRTEC) 10 MG tablet Take 1 tablet (10 mg total) by mouth daily. 90 tablet 3  . fluticasone (FLONASE) 50 MCG/ACT nasal spray USE 2 SPRAYS IN BOTH NOSTRILS DAILY 16 g 9  . ibuprofen (ADVIL,MOTRIN) 600 MG tablet Take 1 tablet (600 mg total) by mouth every 6 (six) hours as needed. 30 tablet 0  . nortriptyline (PAMELOR) 10 MG capsule Take 2 capsules (20 mg total) by mouth at bedtime. (Patient taking differently: Take 10 mg by mouth at bedtime. ) 60 capsule 11  . orphenadrine (NORFLEX) 100 MG tablet Take 1 tablet (100 mg total) by mouth 2 (two) times daily. 30 tablet 0  . PARoxetine (PAXIL) 40 MG tablet Take 1 tablet (40 mg total) by mouth daily. 90 tablet 3   No facility-administered medications prior to visit.     PAST MEDICAL HISTORY: Past Medical History:  Diagnosis Date  . Allergy   . Depression   . Obstructive sleep apnea on CPAP 10/16/2017  . OSA on CPAP     PAST SURGICAL HISTORY: Past Surgical History:  Procedure Laterality Date  . admission  09/24/2003   diverticulitis.    Cathy Gallegos BLEPHAROPLASTY    . CESAREAN SECTION    . TONSILLECTOMY      FAMILY HISTORY: Family History  Problem Relation Age of Onset  . Hypertension Brother   . Throat cancer Father   . Breast cancer Maternal Aunt   . Colon cancer Neg Hx   . Esophageal cancer Neg Hx   . Stomach cancer Neg Hx   . Rectal cancer Neg Hx     SOCIAL HISTORY: Social History   Socioeconomic History  . Marital  status: Divorced    Spouse name: Not on file  . Number of children: 0  . Years of education: BA   . Highest education level: Not on file  Occupational History  . Occupation: N/A  Social Needs  . Financial resource strain: Not on file  . Food insecurity:    Worry: Not on file    Inability: Not on file  . Transportation needs:    Medical: Not on file    Non-medical: Not on file  Tobacco Use  . Smoking status: Never Smoker  . Smokeless tobacco: Never Used  Substance and Sexual Activity  . Alcohol use: Yes    Alcohol/week: 0.0 oz    Comment: Maybe once every 6 months  . Drug use: No  . Sexual activity: Never  Lifestyle  . Physical activity:  Days per week: Not on file    Minutes per session: Not on file  . Stress: Not on file  Relationships  . Social connections:    Talks on phone: Not on file    Gets together: Not on file    Attends religious service: Not on file    Active member of club or organization: Not on file    Attends meetings of clubs or organizations: Not on file    Relationship status: Not on file  . Intimate partner violence:    Fear of current or ex partner: Not on file    Emotionally abused: Not on file    Physically abused: Not on file    Forced sexual activity: Not on file  Other Topics Concern  . Not on file  Social History Narrative   Marital status: single/widowed.      Children: none      Lives: alone      Employment: unemployed;       Tobacco: none      Alcohol: rare      Exercise:  Walking; going to pool.   Drinks diet pepsi daily and recently will drink coffee every so often     PHYSICAL EXAM  Vitals:   01/14/18 1053  BP: 136/79  Pulse: 81  Weight: 180 lb 6.4 oz (81.8 kg)  Height: 5\' 5"  (1.651 m)   Body mass index is 30.02 kg/m.  Generalized: Well developed, obese female in no acute distress  Head: normocephalic and atraumatic,. Oropharynx benign  Neck: Supple,  Musculoskeletal: No deformity   Neurological examination    Mentation: Alert oriented to time, place, history taking. Attention span and concentration appropriate. Recent and remote memory intact.  Follows all commands speech and language fluent.   Cranial nerve II-XII: Pupils were equal round reactive to light extraocular movements were full, visual field were full on confrontational test. Facial sensation and strength were normal. hearing was intact to finger rubbing bilaterally. Uvula tongue midline. head turning and shoulder shrug were normal and symmetric.Tongue protrusion into cheek strength was normal. Motor: normal bulk and tone, full strength in the BUE, BLE, fine finger movements normal, no pronator drift. No focal weakness Sensory: normal and symmetric to light touch,  Coordination: finger-nose-finger,  no dysmetria Gait and Station: Rising up from seated position without assistance, normal stance,  moderate stride, good arm swing, smooth turning, able to perform tiptoe, and heel walking without difficulty. Tandem gait is steady  DIAGNOSTIC DATA (LABS, IMAGING, TESTING) - I reviewed patient records, labs, notes, testing and imaging myself where available.  Lab Results  Component Value Date   WBC 8.1 01/02/2017   HGB 13.6 01/02/2017   HCT 42.3 01/02/2017   MCV 82.8 01/02/2017   PLT 253.0 01/02/2017      Component Value Date/Time   NA 139 01/02/2017 1214   NA 141 11/09/2015 1359   K 3.7 01/02/2017 1214   CL 104 01/02/2017 1214   CO2 30 01/02/2017 1214   GLUCOSE 88 01/02/2017 1214   BUN 19 01/02/2017 1214   BUN 15 11/09/2015 1359   CREATININE 0.80 01/02/2017 1214   CREATININE 0.78 06/16/2015 1005   CALCIUM 9.5 01/02/2017 1214   PROT 6.8 01/02/2017 1214   PROT 6.3 11/09/2015 1359   ALBUMIN 4.0 01/02/2017 1214   ALBUMIN 4.1 11/09/2015 1359   AST 14 01/02/2017 1214   ALT 10 01/02/2017 1214   ALKPHOS 94 01/02/2017 1214   BILITOT 0.3 01/02/2017 1214   BILITOT <  0.2 11/09/2015 1359   GFRNONAA 84 11/09/2015 1359   GFRNONAA 81  06/16/2015 1005   GFRAA 97 11/09/2015 1359   GFRAA >89 06/16/2015 1005   Lab Results  Component Value Date   CHOL 191 01/02/2017   HDL 52.40 01/02/2017   LDLCALC 120 (H) 01/02/2017   TRIG 93.0 01/02/2017   CHOLHDL 4 01/02/2017   Lab Results  Component Value Date   HGBA1C 5.6 10/24/2016   Lab Results  Component Value Date   VITAMINB12 428 11/09/2015   Lab Results  Component Value Date   TSH 3.23 01/02/2017      ASSESSMENT AND PLAN   67 year old female with an underlying medical history of allergies, depression, diverticulosis, bedwetting, gait disorder and ptosis, and overweight state, who presents for follow-up consultation of her mild obstructive sleep apnea, after starting AutoPap therapy.  Compliance data dated 12/13/2017-01/11/2018 shows compliance greater than 4 hours at 70%.  Average usage 5 hours 57 minutes.  Set pressure 5 to 12 cm.  Max pressure 11.7 EPR level 1 AHI 10.16.    PLAN: CPAP compliance 70% greater than 4 hours.   AHI 10.1 will increase pressure slightly to max of 14 Follow-up in 3 months repeat compliance Nilda RiggsNancy Carolyn Martin, [facility] HospitalGNP, [facility] CenterBC, APRN  [facility] [address], [address] [place], [place] [address] [phone]  I reviewed the above note and documentation by the Nurse Practitioner and agree with the history, physical exam, assessment and plan as outlined above. I was immediately available for face-to-face consultation. Huston FoleySaima Athar, MD, PhD [facility])

## 2018-01-14 ENCOUNTER — Ambulatory Visit: Payer: PPO | Admitting: Nurse Practitioner

## 2018-01-14 ENCOUNTER — Encounter: Payer: Self-pay | Admitting: Nurse Practitioner

## 2018-01-14 VITALS — BP 136/79 | HR 81 | Ht 65.0 in | Wt 180.4 lb

## 2018-01-14 DIAGNOSIS — G4733 Obstructive sleep apnea (adult) (pediatric): Secondary | ICD-10-CM

## 2018-01-14 DIAGNOSIS — Z9989 Dependence on other enabling machines and devices: Secondary | ICD-10-CM

## 2018-01-14 NOTE — Patient Instructions (Signed)
CPAP compliance 70% greater than 4 hours.   AHI 10.1 will increase pressure slightly to max of 14 Follow-up in 3 months repeat compliance

## 2018-01-14 NOTE — Progress Notes (Signed)
Fax confirmation received DME [facility] [phone] cpap pressure change. Also relayed to Peggye FothergillAngela Trotter, with [facility] via community message.

## 2018-01-15 ENCOUNTER — Other Ambulatory Visit: Payer: PPO

## 2018-01-20 ENCOUNTER — Telehealth: Payer: Self-pay | Admitting: Nurse Practitioner

## 2018-01-20 DIAGNOSIS — G4733 Obstructive sleep apnea (adult) (pediatric): Secondary | ICD-10-CM

## 2018-01-20 DIAGNOSIS — Z9989 Dependence on other enabling machines and devices: Principal | ICD-10-CM

## 2018-01-20 NOTE — Telephone Encounter (Signed)
Discussed with Dr. Frances Furbish. Will decrease back to 12cm Please call the pt

## 2018-01-20 NOTE — Telephone Encounter (Signed)
Pt called stating the pressure for her CPAP is too high, states she rips the mask off in the middle of the night and feels she "might blow away"

## 2018-01-21 NOTE — Telephone Encounter (Signed)
Attempted to call pt and her mobile VM full, could not LM.   Fax confirmation received for [phone], also let [facility] liason , Peggye Fothergill know as well.

## 2018-01-21 NOTE — Telephone Encounter (Signed)
Spoke to pt and let her know that order was sent for cpap pressure change decrease to 12. She verbalized understanding and will let us know if still problems.

## 2018-01-26 ENCOUNTER — Other Ambulatory Visit: Payer: PPO

## 2018-01-31 DIAGNOSIS — G4733 Obstructive sleep apnea (adult) (pediatric): Secondary | ICD-10-CM | POA: Diagnosis not present

## 2018-02-10 ENCOUNTER — Other Ambulatory Visit: Payer: PPO

## 2018-02-25 ENCOUNTER — Ambulatory Visit: Payer: PPO | Admitting: Neurology

## 2018-02-25 DIAGNOSIS — R519 Headache, unspecified: Secondary | ICD-10-CM

## 2018-02-25 DIAGNOSIS — R2990 Unspecified symptoms and signs involving the nervous system: Secondary | ICD-10-CM

## 2018-02-25 DIAGNOSIS — R51 Headache: Principal | ICD-10-CM
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
## 2018-02-26 ENCOUNTER — Telehealth: Payer: Self-pay | Admitting: Nurse Practitioner

## 2018-02-26 NOTE — Telephone Encounter (Signed)
Dr. Terrace ArabiaYan has left the office for the day.  Dr. Epimenio FootSater is the work-in physician.  He reviewed patient's chart.  Per his vo, patient needs to rest and allow symptoms to pass.  No other intervention required.  Returned call to patient and she is in agreement with this plan.  She will call us back, if symptoms continue to persist without any improvement.
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
## 2018-03-02 ENCOUNTER — Telehealth: Payer: Self-pay | Admitting: Family Medicine

## 2018-03-02 ENCOUNTER — Encounter: Payer: Self-pay | Admitting: *Deleted

## 2018-03-02 ENCOUNTER — Ambulatory Visit (INDEPENDENT_AMBULATORY_CARE_PROVIDER_SITE_OTHER): Payer: PPO | Admitting: *Deleted

## 2018-03-02 VITALS — BP 126/60 | HR 82 | Ht 65.0 in | Wt 181.2 lb

## 2018-03-02 DIAGNOSIS — Z0000 Encounter for general adult medical examination without abnormal findings: Secondary | ICD-10-CM | POA: Diagnosis not present

## 2018-03-02 NOTE — Telephone Encounter (Signed)
Copied from CRM [phone]#[id number]. Topic: Quick Communication - Other Results >> Mar 02, 2018  1:55 PM Ninfa Meekeroole, Bridgett H wrote: Pt said Dr. Patsy Lageropland would remove dark spots on face and side of stomach. Would this be 30 min office visit that I would schedule or would she be referred? Please advise.

## 2018-03-02 NOTE — Patient Instructions (Signed)
Please schedule your next medicare wellness visit with me in 1 yr.  Continue to eat heart healthy diet (full of fruits, vegetables, whole grains, lean protein, water--limit salt, fat, and sugar intake) and increase physical activity as tolerated.  Continue doing brain stimulating activities (puzzles, reading, adult coloring books, staying active) to keep memory sharp.   Bring a copy of your living will and/or healthcare power of attorney to your next office visit.   Cathy Gallegos , Thank you for taking time to come for your Medicare Wellness Visit. I appreciate your ongoing commitment to your health goals. Please review the following plan we discussed and let me know if I can assist you in the future.   These are the goals we discussed: Goals    . Increase physical activity     Walk 10 minutes at least 3x week     . Weight (lb) < 150 lb (68 kg)       This is a list of the screening recommended for you and due dates:  Health Maintenance  Topic Date Due  . Pneumonia vaccines (2 of 2 - PPSV23) 01/02/2018  . Flu Shot  04/23/2018  . Mammogram  06/20/2019  . Colon Cancer Screening  06/29/2025  . Tetanus Vaccine  05/01/2027  . DEXA scan (bone density measurement)  Completed  .  Hepatitis C: One time screening is recommended by Center for Disease Control  (CDC) for  adults born from [birth year] through 1966.   Completed    Health Maintenance for Postmenopausal Women Menopause is a normal process in which your reproductive ability comes to an end. This process happens gradually over a span of months to years, usually between the ages of 73 and 61. Menopause is complete when you have missed 12 consecutive menstrual periods. It is important to talk with your health care provider about some of the most common conditions that affect postmenopausal women, such as heart disease, cancer, and bone loss (osteoporosis). Adopting a healthy lifestyle and getting preventive care can help to promote your health and  wellness. Those actions can also lower your chances of developing some of these common conditions. What should I know about menopause? During menopause, you may experience a number of symptoms, such as:  Moderate-to-severe hot flashes.  Night sweats.  Decrease in sex drive.  Mood swings.  Headaches.  Tiredness.  Irritability.  Memory problems.  Insomnia.  Choosing to treat or not to treat menopausal changes is an individual decision that you make with your health care provider. What should I know about hormone replacement therapy and supplements? Hormone therapy products are effective for treating symptoms that are associated with menopause, such as hot flashes and night sweats. Hormone replacement carries certain risks, especially as you become older. If you are thinking about using estrogen or estrogen with progestin treatments, discuss the benefits and risks with your health care provider. What should I know about heart disease and stroke? Heart disease, heart attack, and stroke become more likely as you age. This may be due, in part, to the hormonal changes that your body experiences during menopause. These can affect how your body processes dietary fats, triglycerides, and cholesterol. Heart attack and stroke are both medical emergencies. There are many things that you can do to help prevent heart disease and stroke:  Have your blood pressure checked at least every 1-2 years. High blood pressure causes heart disease and increases the risk of stroke.  If you are 68-59 years old, ask your  health care provider if you should take aspirin to prevent a heart attack or a stroke.  Do not use any tobacco products, including cigarettes, chewing tobacco, or electronic cigarettes. If you need help quitting, ask your health care provider.  It is important to eat a healthy diet and maintain a healthy weight. ? Be sure to include plenty of vegetables, fruits, low-fat dairy products, and  lean protein. ? Avoid eating foods that are high in solid fats, added sugars, or salt (sodium).  Get regular exercise. This is one of the most important things that you can do for your health. ? Try to exercise for at least 150 minutes each week. The type of exercise that you do should increase your heart rate and make you sweat. This is known as moderate-intensity exercise. ? Try to do strengthening exercises at least twice each week. Do these in addition to the moderate-intensity exercise.  Know your numbers.Ask your health care provider to check your cholesterol and your blood glucose. Continue to have your blood tested as directed by your health care provider.  What should I know about cancer screening? There are several types of cancer. Take the following steps to reduce your risk and to catch any cancer development as early as possible. Breast Cancer  Practice breast self-awareness. ? This means understanding how your breasts normally appear and feel. ? It also means doing regular breast self-exams. Let your health care provider know about any changes, no matter how small.  If you are 59 or older, have a clinician do a breast exam (clinical breast exam or CBE) every year. Depending on your age, family history, and medical history, it may be recommended that you also have a yearly breast X-ray (mammogram).  If you have a family history of breast cancer, talk with your health care provider about genetic screening.  If you are at high risk for breast cancer, talk with your health care provider about having an MRI and a mammogram every year.  Breast cancer (BRCA) gene test is recommended for women who have family members with BRCA-related cancers. Results of the assessment will determine the need for genetic counseling and BRCA1 and for BRCA2 testing. BRCA-related cancers include these types: ? Breast. This occurs in males or females. ? Ovarian. ? Tubal. This may also be called fallopian  tube cancer. ? Cancer of the abdominal or pelvic lining (peritoneal cancer). ? Prostate. ? Pancreatic.  Cervical, Uterine, and Ovarian Cancer Your health care provider may recommend that you be screened regularly for cancer of the pelvic organs. These include your ovaries, uterus, and vagina. This screening involves a pelvic exam, which includes checking for microscopic changes to the surface of your cervix (Pap test).  For women ages 21-65, health care providers may recommend a pelvic exam and a Pap test every three years. For women ages 10-65, they may recommend the Pap test and pelvic exam, combined with testing for human papilloma virus (HPV), every five years. Some types of HPV increase your risk of cervical cancer. Testing for HPV may also be done on women of any age who have unclear Pap test results.  Other health care providers may not recommend any screening for nonpregnant women who are considered low risk for pelvic cancer and have no symptoms. Ask your health care provider if a screening pelvic exam is right for you.  If you have had past treatment for cervical cancer or a condition that could lead to cancer, you need Pap tests and  screening for cancer for at least 20 years after your treatment. If Pap tests have been discontinued for you, your risk factors (such as having a new sexual partner) need to be reassessed to determine if you should start having screenings again. Some women have medical problems that increase the chance of getting cervical cancer. In these cases, your health care provider may recommend that you have screening and Pap tests more often.  If you have a family history of uterine cancer or ovarian cancer, talk with your health care provider about genetic screening.  If you have vaginal bleeding after reaching menopause, tell your health care provider.  There are currently no reliable tests available to screen for ovarian cancer.  Lung Cancer Lung cancer  screening is recommended for adults 27-60 years old who are at high risk for lung cancer because of a history of smoking. A yearly low-dose CT scan of the lungs is recommended if you:  Currently smoke.  Have a history of at least 30 pack-years of smoking and you currently smoke or have quit within the past 15 years. A pack-year is smoking an average of one pack of cigarettes per day for one year.  Yearly screening should:  Continue until it has been 15 years since you quit.  Stop if you develop a health problem that would prevent you from having lung cancer treatment.  Colorectal Cancer  This type of cancer can be detected and can often be prevented.  Routine colorectal cancer screening usually begins at age 22 and continues through age 46.  If you have risk factors for colon cancer, your health care provider may recommend that you be screened at an earlier age.  If you have a family history of colorectal cancer, talk with your health care provider about genetic screening.  Your health care provider may also recommend using home test kits to check for hidden blood in your stool.  A small camera at the end of a tube can be used to examine your colon directly (sigmoidoscopy or colonoscopy). This is done to check for the earliest forms of colorectal cancer.  Direct examination of the colon should be repeated every 5-10 years until age 61. However, if early forms of precancerous polyps or small growths are found or if you have a family history or genetic risk for colorectal cancer, you may need to be screened more often.  Skin Cancer  Check your skin from head to toe regularly.  Monitor any moles. Be sure to tell your health care provider: ? About any new moles or changes in moles, especially if there is a change in a mole's shape or color. ? If you have a mole that is larger than the size of a pencil eraser.  If any of your family members has a history of skin cancer, especially at a  young age, talk with your health care provider about genetic screening.  Always use sunscreen. Apply sunscreen liberally and repeatedly throughout the day.  Whenever you are outside, protect yourself by wearing long sleeves, pants, a wide-brimmed hat, and sunglasses.  What should I know about osteoporosis? Osteoporosis is a condition in which bone destruction happens more quickly than new bone creation. After menopause, you may be at an increased risk for osteoporosis. To help prevent osteoporosis or the bone fractures that can happen because of osteoporosis, the following is recommended:  If you are 71-29 years old, get at least 1,000 mg of calcium and at least 600 mg of vitamin  D per day.  If you are older than age 76 but younger than age 34, get at least 1,200 mg of calcium and at least 600 mg of vitamin D per day.  If you are older than age 59, get at least 1,200 mg of calcium and at least 800 mg of vitamin D per day.  Smoking and excessive alcohol intake increase the risk of osteoporosis. Eat foods that are rich in calcium and vitamin D, and do weight-bearing exercises several times each week as directed by your health care provider. What should I know about how menopause affects my mental health? Depression may occur at any age, but it is more common as you become older. Common symptoms of depression include:  Low or sad mood.  Changes in sleep patterns.  Changes in appetite or eating patterns.  Feeling an overall lack of motivation or enjoyment of activities that you previously enjoyed.  Frequent crying spells.  Talk with your health care provider if you think that you are experiencing depression. What should I know about immunizations? It is important that you get and maintain your immunizations. These include:  Tetanus, diphtheria, and pertussis (Tdap) booster vaccine.  Influenza every year before the flu season begins.  Pneumonia vaccine.  Shingles vaccine.  Your  health care provider may also recommend other immunizations. This information is not intended to replace advice given to you by your health care provider. Make sure you discuss any questions you have with your health care provider. Document Released: 11/01/2005 Document Revised: 03/29/2016 Document Reviewed: 06/13/2015 Elsevier Interactive Patient Education  2018 [employer].

## 2018-03-02 NOTE — Telephone Encounter (Signed)
I think she just means seb k removal. I can do this for her Please schedule for 30 minutes

## 2018-03-03 DIAGNOSIS — G4733 Obstructive sleep apnea (adult) (pediatric): Secondary | ICD-10-CM | POA: Diagnosis not present

## 2018-03-03 NOTE — Telephone Encounter (Signed)
Called pt on 03/02/18 and 03/03/18 but her mailbox is full. Will keep trying to get in touch with patient.

## 2018-03-04 NOTE — Telephone Encounter (Signed)
Pt is scheduled for 03/09/18 @ 2:45 pm

## 2018-03-06 DIAGNOSIS — H524 Presbyopia: Secondary | ICD-10-CM | POA: Diagnosis not present

## 2018-03-06 NOTE — Procedures (Signed)
   HISTORY: 67 years old female, presented with confusion spells,  TECHNIQUE:  16 channel EEG was performed based on standard 10-16 international system. One channel was dedicated to EKG, which has demonstrates normal sinus rhythm of 72 beats per minutes.  Upon awakening, the posterior background activity was well-developed, in alpha range, 10 Hz, reactive to eye opening and closure.  There was no evidence of epileptiform discharge.  Photic stimulation was performed, which induced a symmetric photic driving.  Hyperventilation was performed, there was no abnormality elicit.  No sleep was achieved.  CONCLUSION: This is a  normal awake EEG.  There is no electrodiagnostic evidence of epileptiform discharge.  Levert FeinsteinYijun Castulo Scarpelli, M.D. Ph.D.  [facility] [address] [place], [place] [address] Phone: [phone] Fax:      [phone]

## 2018-03-09 ENCOUNTER — Ambulatory Visit: Payer: PPO | Admitting: Family Medicine

## 2018-04-01 ENCOUNTER — Ambulatory Visit: Payer: Self-pay | Admitting: *Deleted

## 2018-04-01 ENCOUNTER — Emergency Department (HOSPITAL_BASED_OUTPATIENT_CLINIC_OR_DEPARTMENT_OTHER)
Admission: EM | Admit: 2018-04-01 | Discharge: 2018-04-01 | Disposition: A | Discharge status: Home / Self Care | Payer: PPO | Attending: Emergency Medicine | Admitting: Emergency Medicine

## 2018-04-01 ENCOUNTER — Encounter (HOSPITAL_BASED_OUTPATIENT_CLINIC_OR_DEPARTMENT_OTHER): Payer: Self-pay

## 2018-04-01 ENCOUNTER — Other Ambulatory Visit: Payer: Self-pay

## 2018-04-01 DIAGNOSIS — Z79899 Other long term (current) drug therapy: Secondary | ICD-10-CM | POA: Diagnosis not present

## 2018-04-01 DIAGNOSIS — T7840XA Allergy, unspecified, initial encounter: Secondary | ICD-10-CM | POA: Diagnosis not present

## 2018-04-01 DIAGNOSIS — Z91040 Latex allergy status: Secondary | ICD-10-CM | POA: Insufficient documentation

## 2018-04-01 MED ORDER — PREDNISONE 10 MG PO TABS
60.0000 mg | ORAL_TABLET | Freq: Once | ORAL | Status: AC
  Administered 2018-04-01: 60 mg via ORAL
  Filled 2018-04-01: qty 1

## 2018-04-01 MED ORDER — PREDNISONE 20 MG PO TABS: ORAL_TABLET | ORAL | 0 refills | Status: DC

## 2018-04-01 MED ORDER — RANITIDINE HCL 150 MG PO TABS: 150.0000 mg | ORAL_TABLET | Freq: Two times a day (BID) | ORAL | 0 refills | Status: DC

## 2018-04-01 MED ORDER — FAMOTIDINE 20 MG PO TABS
20.0000 mg | ORAL_TABLET | Freq: Once | ORAL | Status: AC
  Administered 2018-04-01: 20 mg via ORAL
  Filled 2018-04-01: qty 1

## 2018-04-01 MED ORDER — EPINEPHRINE 0.3 MG/0.3ML IJ SOAJ: 0.3000 mg | Freq: Once | INTRAMUSCULAR | 2 refills | Status: DC | PRN
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
## 2018-04-01 NOTE — Discharge Instructions (Signed)
Take home zyrtec as directed; use zantac as directed, taking your next dose of zantac tonight with dinner. Take prednisone as directed until completed, starting tomorrow since you received today's dose here. If you develop signs or symptoms of severe allergic reaction (anaphylactic reaction, as outlined below) then use the EpiPen and call 911 immediately in order to get emergent medical care.  Continue your usual home medications. Get plenty of rest and drink plenty of fluids. Avoid any known triggers. Please followup with your primary doctor in 3-5 days for recheck of symptoms and for discussion of your diagnoses and further evaluation after today's visit. Return to the ER for changes or worsening symptoms

## 2018-04-01 NOTE — ED Triage Notes (Signed)
Pt states she is having an allergic reaction that started overnight.  Pt states she is swelling, no swelling appreciated in triage, pt drinking a soda and speaking in complete sentences without difficulty.  She took zyrtec an hour ago.

## 2018-04-01 NOTE — Telephone Encounter (Signed)
Patient phoned with what she thinks may be an allergic reaction to some latex that she came in contact with yesterday, at work.  She describes swelling on the face, lips and ears. Throat feels scratchy, possibly narrowing. Swelling with hands, lower legs and feet. No rash  No itching. She began noticing the swelling upon waking this morning. She denies difficulty swallowing and drooling. Took zytec 10 MG one hour ago and stated she notices slight improvement in symptoms.  TC to PCP-no availability to work patient in schedule this afternoon. Advised UC or ER treatment at this time.   Reason for Disposition . SEVERE swelling of the entire face  Answer Assessment - Initial Assessment Questions 1. ONSET: "When did the swelling start?" (e.g., minutes, hours, days)     This morning.  2. LOCATION: "What part of the face is swollen?"     Entire face looks puffy, inside of ears.  3. SEVERITY: "How swollen is it?"     Ankles, wrists, hands, feet, feel very tight. 4. ITCHING: "Is there any itching?" If so, ask: "How much?"   (Scale 1-10; mild, moderate or severe)     No.  5. PAIN: "Is the swelling painful to touch?" If so, ask: "How painful is it?"   (Scale 1-10; mild, moderate or severe)     no 6. FEVER: "Do you have a fever?" If so, ask: "What is it, how was it measured, and when did it start?"      Has had chills on and off this morning.  7. CAUSE: "What do you think is causing the face swelling?"    Handled latex products at work yesterday.  8. RECURRENT SYMPTOM: "Have you had face swelling before?" If so, ask: "When was the last time?" "What happened that time?"     Yes, years ago. 9. OTHER SYMPTOMS: "Do you have any other symptoms?" (e.g., toothache, leg swelling)    Ankles and feet. 10. PREGNANCY: "Is there any chance you are pregnant?" "When was your last menstrual period?"       no  Protocols used: [facility] Services[facility] SWELLING-[facility]-AH

## 2018-04-01 NOTE — Telephone Encounter (Signed)
[facility] triage note routed to Dr. Patsy Lageropland as Lorain ChildesFYI. Pt. Apparently en route to ED.

## 2018-04-02 DIAGNOSIS — G4733 Obstructive sleep apnea (adult) (pediatric): Secondary | ICD-10-CM | POA: Diagnosis not present
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
## 2018-04-22 ENCOUNTER — Encounter: Payer: Self-pay | Admitting: Nurse Practitioner

## 2018-04-22 ENCOUNTER — Ambulatory Visit: Payer: PPO | Admitting: Nurse Practitioner

## 2018-04-22 ENCOUNTER — Telehealth: Payer: Self-pay | Admitting: *Deleted

## 2018-04-22 ENCOUNTER — Ambulatory Visit (INDEPENDENT_AMBULATORY_CARE_PROVIDER_SITE_OTHER): Payer: PPO | Admitting: Nurse Practitioner

## 2018-04-22 VITALS — BP 158/88 | HR 79 | Ht 65.0 in | Wt 182.0 lb

## 2018-04-22 DIAGNOSIS — G4733 Obstructive sleep apnea (adult) (pediatric): Secondary | ICD-10-CM

## 2018-04-22 DIAGNOSIS — Z9989 Dependence on other enabling machines and devices: Secondary | ICD-10-CM

## 2018-04-22 NOTE — Progress Notes (Signed)
Spoke with A Trotter/ [facility] re: new CPAP orders placed today.

## 2018-04-22 NOTE — Telephone Encounter (Signed)
Called Cathy Gallegos/ [facility] to advise her of new CPAP orders placed today. Inquired about the pressure which is at 12 currently but had been increased to 14 after office visit on 01/14/18.   [name] stated per her notes, it was increased to 14 in April, however they received an order May 1st to decrease back to 12. Per Epic, this was following a call the patient made on 01/20/18  stating the pressure was too high; she was pulling the mask off.  [name] will pull new orders from Epic and send over today. She verbalized understanding, appreciation of call.

## 2018-04-22 NOTE — Patient Instructions (Signed)
CPAP compliance 43% greater than 4 hours.   AHI 26.4 will increase pressure  to max of 14 this was suppose to be [facility] at last visit[facility] Patient has significant leak needs mask refit Follow-up in 3 months repeat compliance

## 2018-04-22 NOTE — Telephone Encounter (Signed)
So what does that mean?

## 2018-05-03 DIAGNOSIS — G4733 Obstructive sleep apnea (adult) (pediatric): Secondary | ICD-10-CM | POA: Diagnosis not present

## 2018-06-01 ENCOUNTER — Other Ambulatory Visit: Payer: Self-pay | Admitting: Family Medicine

## 2018-06-01 DIAGNOSIS — F32A Depression, unspecified: Secondary | ICD-10-CM

## 2018-06-01 DIAGNOSIS — F329 Major depressive disorder, single episode, unspecified: Secondary | ICD-10-CM

## 2018-06-03 ENCOUNTER — Emergency Department (HOSPITAL_BASED_OUTPATIENT_CLINIC_OR_DEPARTMENT_OTHER)
Admission: EM | Admit: 2018-06-03 | Discharge: 2018-06-03 | Disposition: A | Discharge status: Home / Self Care | Payer: PPO | Attending: Emergency Medicine | Admitting: Emergency Medicine

## 2018-06-03 ENCOUNTER — Encounter (HOSPITAL_BASED_OUTPATIENT_CLINIC_OR_DEPARTMENT_OTHER): Payer: Self-pay | Admitting: Emergency Medicine

## 2018-06-03 ENCOUNTER — Other Ambulatory Visit: Payer: Self-pay

## 2018-06-03 ENCOUNTER — Emergency Department (HOSPITAL_BASED_OUTPATIENT_CLINIC_OR_DEPARTMENT_OTHER): Payer: PPO

## 2018-06-03 DIAGNOSIS — J1089 Influenza due to other identified influenza virus with other manifestations: Secondary | ICD-10-CM | POA: Diagnosis not present

## 2018-06-03 DIAGNOSIS — J111 Influenza due to unidentified influenza virus with other respiratory manifestations: Secondary | ICD-10-CM

## 2018-06-03 DIAGNOSIS — G4733 Obstructive sleep apnea (adult) (pediatric): Secondary | ICD-10-CM | POA: Diagnosis not present

## 2018-06-03 DIAGNOSIS — J069 Acute upper respiratory infection, unspecified: Secondary | ICD-10-CM | POA: Diagnosis present

## 2018-06-03 DIAGNOSIS — R0602 Shortness of breath: Secondary | ICD-10-CM | POA: Diagnosis not present

## 2018-06-03 DIAGNOSIS — R69 Illness, unspecified: Secondary | ICD-10-CM

## 2018-06-03 DIAGNOSIS — Z79899 Other long term (current) drug therapy: Secondary | ICD-10-CM | POA: Diagnosis not present

## 2018-06-03 DIAGNOSIS — R05 Cough: Secondary | ICD-10-CM | POA: Diagnosis not present

## 2018-06-03 LAB — INFLUENZA PANEL BY PCR (TYPE A & B)
INFLAPCR: NEGATIVE
Influenza B By PCR: NEGATIVE

## 2018-06-03 MED ORDER — ACETAMINOPHEN 325 MG PO TABS
650.0000 mg | ORAL_TABLET | Freq: Once | ORAL | Status: AC
  Administered 2018-06-03: 650 mg via ORAL
  Filled 2018-06-03: qty 2

## 2018-06-03 MED ORDER — OSELTAMIVIR PHOSPHATE 75 MG PO CAPS: 75.0000 mg | ORAL_CAPSULE | Freq: Two times a day (BID) | ORAL | 0 refills | Status: DC
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
## 2018-06-03 NOTE — Discharge Instructions (Addendum)
You are evaluated in the emergency department for fevers chills nasal congestion and cough.  You had a chest x-ray that was negative for pneumonia.  You have a flu test that is pending.  We are prescribing you Tamiflu to take if the test is positive.  You should continue to take Tylenol and ibuprofen and drink plenty of fluids.  Please contact your primary care doctor for follow-up and return if any worsening symptoms.

## 2018-06-03 NOTE — ED Triage Notes (Signed)
Pt /o URI symptoms  x 3 days 

## 2018-06-08 ENCOUNTER — Ambulatory Visit: Payer: PPO | Admitting: Family Medicine
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
## 2018-06-10 ENCOUNTER — Encounter: Payer: Self-pay | Admitting: Family Medicine

## 2018-06-10 ENCOUNTER — Ambulatory Visit: Payer: PPO | Admitting: Family Medicine

## 2018-06-10 ENCOUNTER — Ambulatory Visit (INDEPENDENT_AMBULATORY_CARE_PROVIDER_SITE_OTHER): Payer: PPO | Admitting: Family Medicine

## 2018-06-10 VITALS — BP 120/82 | HR 86 | Temp 98.1°F | Resp 16 | Ht 65.0 in | Wt 179.0 lb

## 2018-06-10 DIAGNOSIS — J0110 Acute frontal sinusitis, unspecified: Secondary | ICD-10-CM

## 2018-06-10 DIAGNOSIS — F329 Major depressive disorder, single episode, unspecified: Secondary | ICD-10-CM | POA: Diagnosis not present

## 2018-06-10 DIAGNOSIS — F4320 Adjustment disorder, unspecified: Secondary | ICD-10-CM

## 2018-06-10 DIAGNOSIS — J209 Acute bronchitis, unspecified: Secondary | ICD-10-CM | POA: Diagnosis not present

## 2018-06-10 DIAGNOSIS — R4589 Other symptoms and signs involving emotional state: Secondary | ICD-10-CM

## 2018-06-10 DIAGNOSIS — J302 Other seasonal allergic rhinitis: Secondary | ICD-10-CM | POA: Diagnosis not present

## 2018-06-10 MED ORDER — DOXYCYCLINE HYCLATE 100 MG PO CAPS: 100.0000 mg | ORAL_CAPSULE | Freq: Two times a day (BID) | ORAL | 0 refills | Status: DC

## 2018-06-10 MED ORDER — PAROXETINE HCL 40 MG PO TABS: ORAL_TABLET | ORAL | 3 refills | Status: DC

## 2018-06-10 MED ORDER — FLUTICASONE PROPIONATE 50 MCG/ACT NA SUSP: NASAL | 9 refills | Status: DC

## 2018-06-10 NOTE — Patient Instructions (Signed)
We will treat your for bronchitis/ sinus infection with doxycycline - antibiotic, take it twice a day for 10 days with food You might consider doing some counseling about your feeling of being emotionally shut off; [facility] does have a counseling division which may be helpful for you
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
## 2018-07-03 DIAGNOSIS — G4733 Obstructive sleep apnea (adult) (pediatric): Secondary | ICD-10-CM | POA: Diagnosis not present

## 2018-07-27 ENCOUNTER — Telehealth: Payer: Self-pay | Admitting: Nurse Practitioner
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
## 2018-07-27 NOTE — Telephone Encounter (Signed)
Spoke to patient - she is aware her EEG was normal.

## 2018-07-27 NOTE — Telephone Encounter (Signed)
Pt requesting a call stating she never heard back regarding the results for her EEG appt from 6/5 please advise

## 2018-07-28 ENCOUNTER — Ambulatory Visit: Payer: PPO | Admitting: Nurse Practitioner

## 2018-11-28 ENCOUNTER — Other Ambulatory Visit: Payer: Self-pay | Admitting: Family Medicine

## 2018-11-28 DIAGNOSIS — J302 Other seasonal allergic rhinitis: Secondary | ICD-10-CM

## 2019-02-14 IMAGING — DX DG CHEST 2V
2 series · 2 of 2 positions shown · non-contrast
Comparison: 02/12/2011

CLINICAL DATA: Fatigue, cough, fever, shortness of breath. Head
congestion.

EXAM:
CHEST - 2 VIEW

[chest pa]
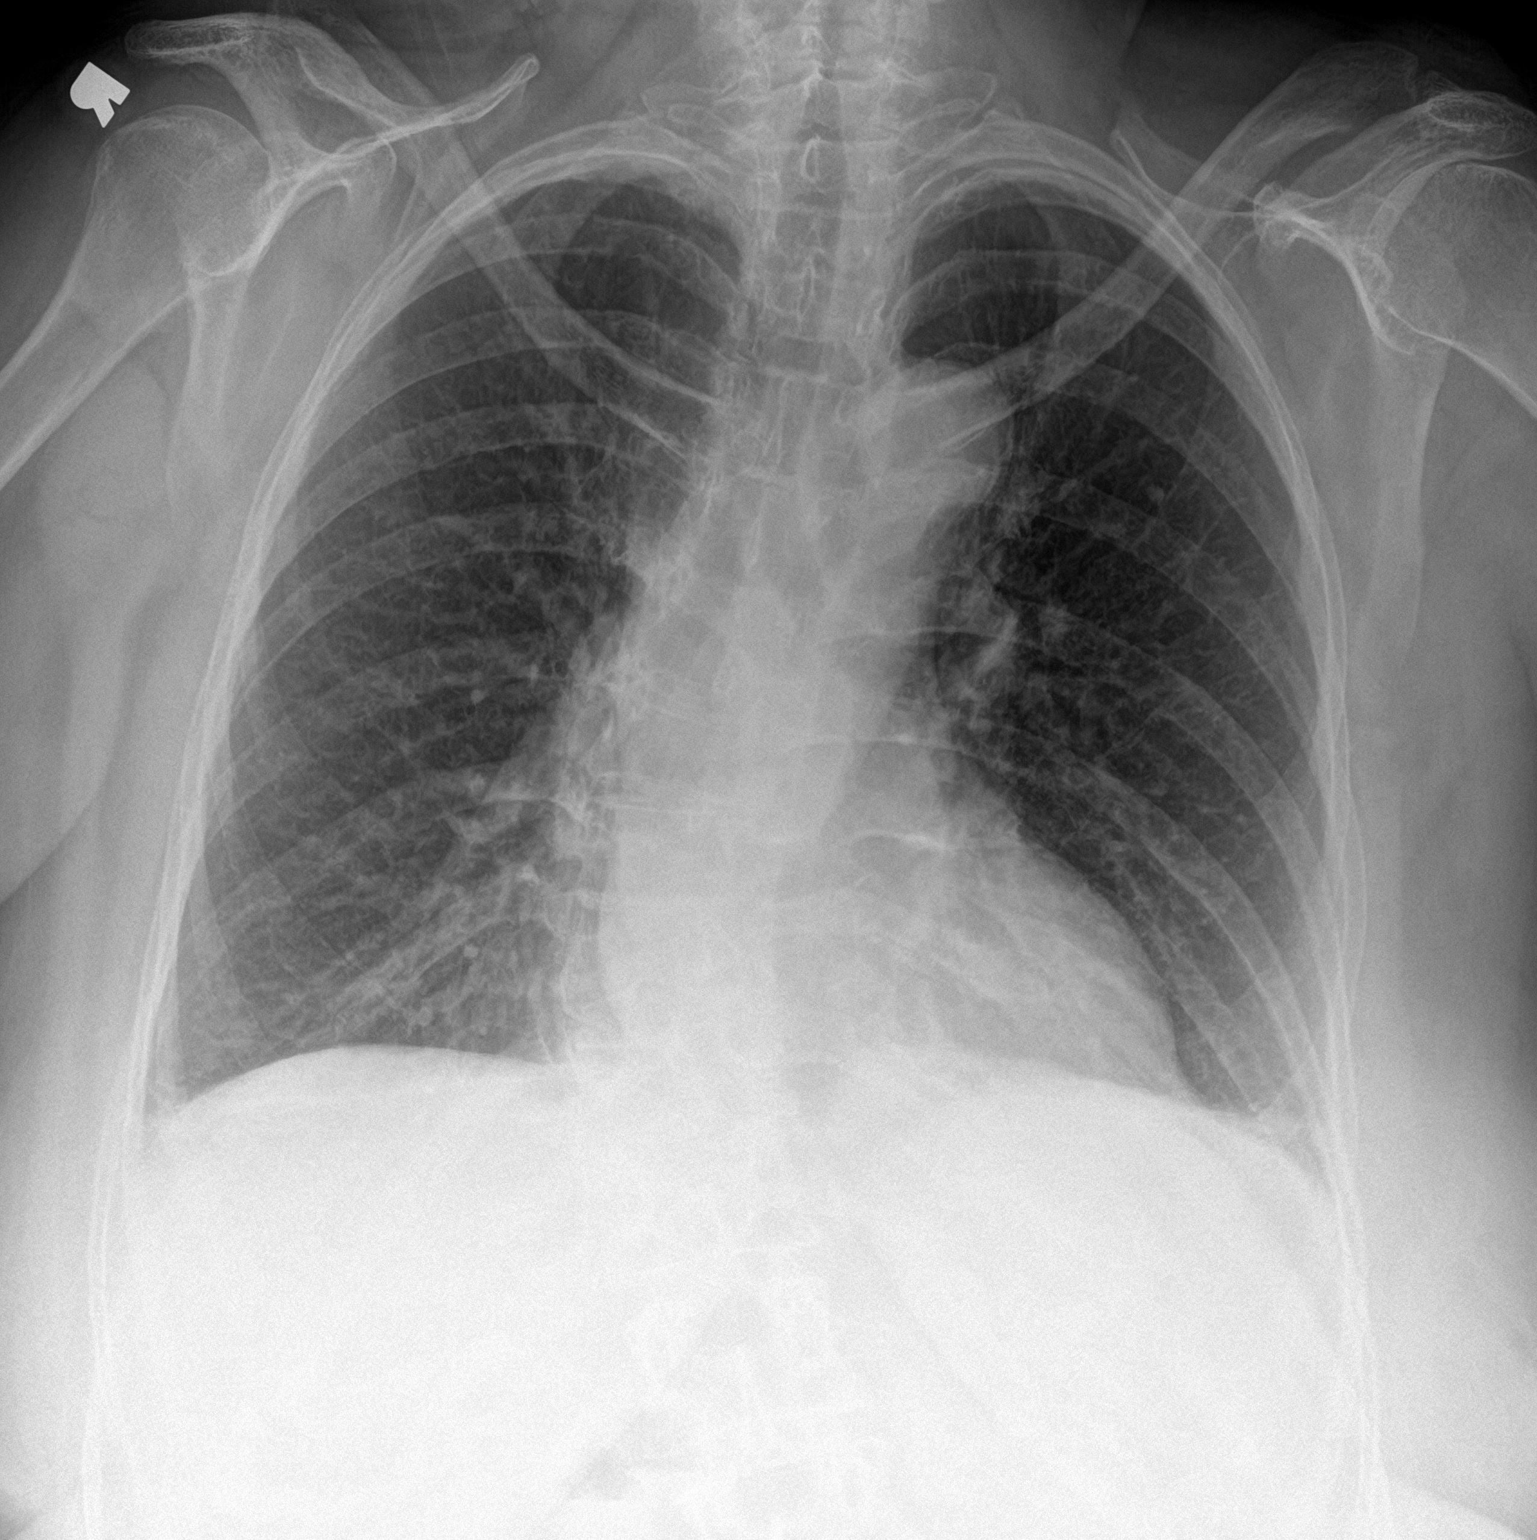

[chest lat]
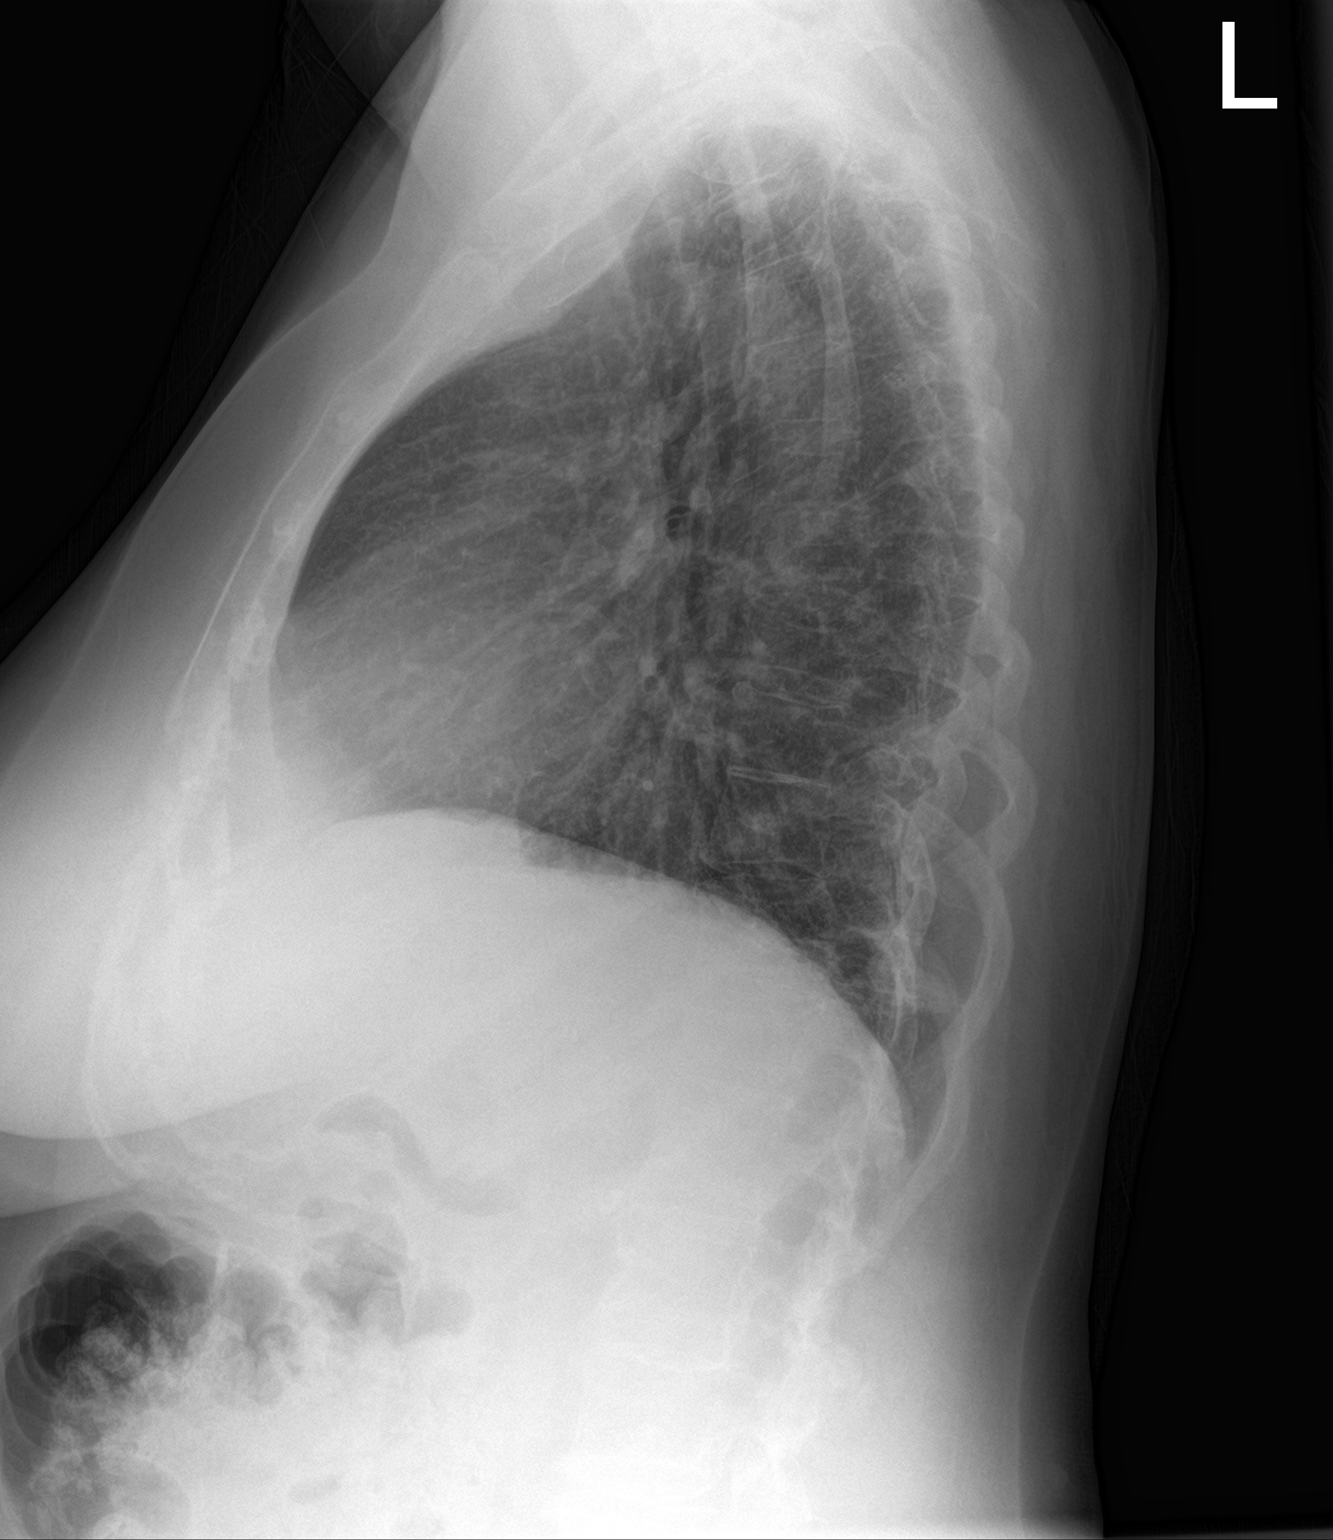

[2 of 2 positions shown; findings below may reference images not displayed]

FINDINGS: Low lung volumes are present, causing crowding of the pulmonary
vasculature. Dextroconvex thoracic scoliosis. Atherosclerotic
calcification of the aortic arch. Scarring along the left lateral
costophrenic angle, similar to prior. The lungs appear otherwise
clear.
IMPRESSION: 1. No pneumonia is identified. There is some mild scarring at the
left lateral costophrenic angle.
2. Dextroconvex thoracic scoliosis.
3.  Aortic Atherosclerosis ([id number]).
4. Low lung volumes.
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
## 2019-03-04 ENCOUNTER — Ambulatory Visit (INDEPENDENT_AMBULATORY_CARE_PROVIDER_SITE_OTHER): Payer: PPO | Admitting: *Deleted

## 2019-03-04 ENCOUNTER — Other Ambulatory Visit: Payer: Self-pay

## 2019-03-04 ENCOUNTER — Encounter: Payer: Self-pay | Admitting: *Deleted

## 2019-03-04 DIAGNOSIS — Z0000 Encounter for general adult medical examination without abnormal findings: Secondary | ICD-10-CM

## 2019-03-04 DIAGNOSIS — Z1231 Encounter for screening mammogram for malignant neoplasm of breast: Secondary | ICD-10-CM

## 2019-03-04 NOTE — Patient Instructions (Signed)
See you next year.  Continue to eat heart healthy diet (full of fruits, vegetables, whole grains, lean protein, water--limit salt, fat, and sugar intake) and increase physical activity as tolerated.  Continue doing brain stimulating activities (puzzles, reading, adult coloring books, staying active) to keep memory sharp.   I have ordered your mammogram.   Cathy Gallegos , Thank you for taking time to come for your Medicare Wellness Visit. I appreciate your ongoing commitment to your health goals. Please review the following plan we discussed and let me know if I can assist you in the future.   These are the goals we discussed: Goals    . Increase physical activity     Walk 10 minutes at least 3x week     . Take a vacation.    . Weight (lb) < 150 lb (68 kg)       This is a list of the screening recommended for you and due dates:  Health Maintenance  Topic Date Due  . Pneumonia vaccines (2 of 2 - PPSV23) 01/02/2018  . Flu Shot  04/24/2019  . Mammogram  06/20/2019  . Colon Cancer Screening  06/29/2025  . Tetanus Vaccine  05/01/2027  . DEXA scan (bone density measurement)  Completed  .  Hepatitis C: One time screening is recommended by Center for Disease Control  (CDC) for  adults born from [birth year] through 1966.   Completed    Health Maintenance After Age 50 After age 84, you are at a higher risk for certain long-term diseases and infections as well as injuries from falls. Falls are a major cause of broken bones and head injuries in people who are older than age 6. Getting regular preventive care can help to keep you healthy and well. Preventive care includes getting regular testing and making lifestyle changes as recommended by your health care provider. Talk with your health care provider about:  Which screenings and tests you should have. A screening is a test that checks for a disease when you have no symptoms.  A diet and exercise plan that is right for you. What should I know about  screenings and tests to prevent falls? Screening and testing are the best ways to find a health problem early. Early diagnosis and treatment give you the best chance of managing medical conditions that are common after age 18. Certain conditions and lifestyle choices may make you more likely to have a fall. Your health care provider may recommend:  Regular vision checks. Poor vision and conditions such as cataracts can make you more likely to have a fall. If you wear glasses, make sure to get your prescription updated if your vision changes.  Medicine review. Work with your health care provider to regularly review all of the medicines you are taking, including over-the-counter medicines. Ask your health care provider about any side effects that may make you more likely to have a fall. Tell your health care provider if any medicines that you take make you feel dizzy or sleepy.  Osteoporosis screening. Osteoporosis is a condition that causes the bones to get weaker. This can make the bones weak and cause them to break more easily.  Blood pressure screening. Blood pressure changes and medicines to control blood pressure can make you feel dizzy.  Strength and balance checks. Your health care provider may recommend certain tests to check your strength and balance while standing, walking, or changing positions.  Foot health exam. Foot pain and numbness, as well as not  wearing proper footwear, can make you more likely to have a fall.  Depression screening. You may be more likely to have a fall if you have a fear of falling, feel emotionally low, or feel unable to do activities that you used to do.  Alcohol use screening. Using too much alcohol can affect your balance and may make you more likely to have a fall. What actions can I take to lower my risk of falls? General instructions  Talk with your health care provider about your risks for falling. Tell your health care provider if: ? You fall. Be sure  to tell your health care provider about all falls, even ones that seem minor. ? You feel dizzy, sleepy, or off-balance.  Take over-the-counter and prescription medicines only as told by your health care provider. These include any supplements.  Eat a healthy diet and maintain a healthy weight. A healthy diet includes low-fat dairy products, low-fat (lean) meats, and fiber from whole grains, beans, and lots of fruits and vegetables. Home safety  Remove any tripping hazards, such as rugs, cords, and clutter.  Install safety equipment such as grab bars in bathrooms and safety rails on stairs.  Keep rooms and walkways well-lit. Activity   Follow a regular exercise program to stay fit. This will help you maintain your balance. Ask your health care provider what types of exercise are appropriate for you.  If you need a cane or walker, use it as recommended by your health care provider.  Wear supportive shoes that have nonskid soles. Lifestyle  Do not drink alcohol if your health care provider tells you not to drink.  If you drink alcohol, limit how much you have: ? 0-1 drink a day for women. ? 0-2 drinks a day for men.  Be aware of how much alcohol is in your drink. In the [place] one drink equals one typical bottle of beer (12 oz), one-half glass of wine (5 oz), or one shot of hard liquor (1 oz).  Do not use any products that contain nicotine or tobacco, such as cigarettes and e-cigarettes. If you need help quitting, ask your health care provider. Summary  Having a healthy lifestyle and getting preventive care can help to protect your health and wellness after age 69.  Screening and testing are the best way to find a health problem early and help you avoid having a fall. Early diagnosis and treatment give you the best chance for managing medical conditions that are more common for people who are older than age 69.  Falls are a major cause of broken bones and head injuries in people  who are older than age 69. Take precautions to prevent a fall at home.  Work with your health care provider to learn what changes you can make to improve your health and wellness and to prevent falls. This information is not intended to replace advice given to you by your health care provider. Make sure you discuss any questions you have with your health care provider. Document Released: 07/23/2017 Document Revised: 07/23/2017 Document Reviewed: 07/23/2017 Elsevier Interactive Patient Education  2019 [employer]

## 2019-03-16 ENCOUNTER — Inpatient Hospital Stay (HOSPITAL_BASED_OUTPATIENT_CLINIC_OR_DEPARTMENT_OTHER): Admission: RE | Admit: 2019-03-16 | Payer: PPO | Source: Ambulatory Visit

## 2019-03-23 ENCOUNTER — Encounter (HOSPITAL_BASED_OUTPATIENT_CLINIC_OR_DEPARTMENT_OTHER): Payer: Self-pay

## 2019-03-23 ENCOUNTER — Ambulatory Visit (HOSPITAL_BASED_OUTPATIENT_CLINIC_OR_DEPARTMENT_OTHER)
Admission: RE | Admit: 2019-03-23 | Discharge: 2019-03-23 | Disposition: A | Discharge status: Home / Self Care | Payer: PPO | Source: Ambulatory Visit | Attending: Family Medicine | Admitting: Family Medicine

## 2019-03-23 ENCOUNTER — Other Ambulatory Visit: Payer: Self-pay

## 2019-03-23 DIAGNOSIS — Z1231 Encounter for screening mammogram for malignant neoplasm of breast: Secondary | ICD-10-CM | POA: Insufficient documentation

## 2019-06-14 ENCOUNTER — Other Ambulatory Visit: Payer: Self-pay | Admitting: *Deleted

## 2019-06-14 DIAGNOSIS — J302 Other seasonal allergic rhinitis: Secondary | ICD-10-CM

## 2019-06-14 MED ORDER — CETIRIZINE HCL 10 MG PO TABS: ORAL_TABLET | ORAL | 1 refills | Status: DC

## 2019-08-12 ENCOUNTER — Encounter: Payer: Self-pay | Admitting: Family Medicine

## 2019-08-12 ENCOUNTER — Telehealth: Payer: Self-pay

## 2019-08-12 ENCOUNTER — Other Ambulatory Visit: Payer: Self-pay

## 2019-08-12 ENCOUNTER — Ambulatory Visit (INDEPENDENT_AMBULATORY_CARE_PROVIDER_SITE_OTHER): Payer: PPO | Admitting: Family Medicine

## 2019-08-12 DIAGNOSIS — S060X0D Concussion without loss of consciousness, subsequent encounter: Secondary | ICD-10-CM | POA: Diagnosis not present

## 2019-08-12 NOTE — Telephone Encounter (Signed)
Copied from [place] [id number]. Topic: General - Other >> Aug 12, 2019  9:43 AM Rainey Pines A wrote: Patient is requesting callback in regards to if Dr. Lorelei Pont would be able to provide documentation to the city of [facility] whom has given her a deadline of November 23rd to have some items removed from her property. Patient stated that due to her health she is unable to do this in a timely manner without being exhausted or experiencing dizziness. Please advise
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
## 2019-08-12 NOTE — Telephone Encounter (Signed)
Scheduled patient for telephone visit this afternoon as you have not seen her in over a year.

## 2019-08-31 ENCOUNTER — Other Ambulatory Visit: Payer: Self-pay

## 2019-09-01 ENCOUNTER — Encounter: Payer: Self-pay | Admitting: Family Medicine

## 2019-09-01 ENCOUNTER — Ambulatory Visit (INDEPENDENT_AMBULATORY_CARE_PROVIDER_SITE_OTHER): Payer: PPO | Admitting: Family Medicine

## 2019-09-01 DIAGNOSIS — Z131 Encounter for screening for diabetes mellitus: Secondary | ICD-10-CM

## 2019-09-01 DIAGNOSIS — Z13220 Encounter for screening for lipoid disorders: Secondary | ICD-10-CM

## 2019-09-01 DIAGNOSIS — N39498 Other specified urinary incontinence: Secondary | ICD-10-CM | POA: Diagnosis not present

## 2019-09-01 DIAGNOSIS — E2839 Other primary ovarian failure: Secondary | ICD-10-CM

## 2019-09-01 DIAGNOSIS — Z23 Encounter for immunization: Secondary | ICD-10-CM

## 2019-09-01 DIAGNOSIS — F329 Major depressive disorder, single episode, unspecified: Secondary | ICD-10-CM

## 2019-09-01 DIAGNOSIS — Z130 Encounter for screening for diseases of the blood and blood-forming organs and certain disorders involving the immune mechanism: Secondary | ICD-10-CM | POA: Diagnosis not present

## 2019-09-01 DIAGNOSIS — M810 Age-related osteoporosis without current pathological fracture: Secondary | ICD-10-CM | POA: Diagnosis not present

## 2019-09-01 MED ORDER — PAROXETINE HCL 40 MG PO TABS: ORAL_TABLET | ORAL | 3 refills | Status: DC
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
## 2019-09-07 ENCOUNTER — Encounter: Payer: Self-pay | Admitting: Family Medicine

## 2019-09-07 ENCOUNTER — Ambulatory Visit (HOSPITAL_BASED_OUTPATIENT_CLINIC_OR_DEPARTMENT_OTHER)
Admission: RE | Admit: 2019-09-07 | Discharge: 2019-09-07 | Disposition: A | Discharge status: Home / Self Care | Payer: PPO | Source: Ambulatory Visit | Attending: Family Medicine | Admitting: Family Medicine

## 2019-09-07 ENCOUNTER — Other Ambulatory Visit: Payer: Self-pay

## 2019-09-07 DIAGNOSIS — M810 Age-related osteoporosis without current pathological fracture: Secondary | ICD-10-CM | POA: Insufficient documentation

## 2019-09-07 DIAGNOSIS — Z780 Asymptomatic menopausal state: Secondary | ICD-10-CM | POA: Diagnosis not present

## 2019-09-07 DIAGNOSIS — E2839 Other primary ovarian failure: Secondary | ICD-10-CM | POA: Insufficient documentation

## 2019-11-10 ENCOUNTER — Other Ambulatory Visit: Payer: Self-pay

## 2019-11-10 ENCOUNTER — Other Ambulatory Visit (INDEPENDENT_AMBULATORY_CARE_PROVIDER_SITE_OTHER): Payer: PPO

## 2019-11-10 DIAGNOSIS — Z130 Encounter for screening for diseases of the blood and blood-forming organs and certain disorders involving the immune mechanism: Secondary | ICD-10-CM

## 2019-11-10 DIAGNOSIS — Z131 Encounter for screening for diabetes mellitus: Secondary | ICD-10-CM

## 2019-11-10 DIAGNOSIS — R7303 Prediabetes: Secondary | ICD-10-CM

## 2019-11-10 DIAGNOSIS — Z13220 Encounter for screening for lipoid disorders: Secondary | ICD-10-CM

## 2019-11-10 LAB — COMPREHENSIVE METABOLIC PANEL
ALT: 12 U/L (ref 0–35)
AST: 14 U/L (ref 0–37)
Albumin: 3.9 g/dL (ref 3.5–5.2)
Alkaline Phosphatase: 96 U/L (ref 39–117)
BUN: 15 mg/dL (ref 6–23)
CO2: 31 mEq/L (ref 19–32)
Calcium: 9.1 mg/dL (ref 8.4–10.5)
Chloride: 105 mEq/L (ref 96–112)
Creatinine, Ser: 0.76 mg/dL (ref 0.40–1.20)
GFR: 75.4 mL/min (ref >60.00)
Glucose, Bld: 90 mg/dL (ref 70–99)
Potassium: 3.8 mEq/L (ref 3.5–5.1)
Sodium: 142 mEq/L (ref 135–145)
Total Bilirubin: 0.4 mg/dL (ref 0.2–1.2)
Total Protein: 6.2 g/dL (ref 6.0–8.3)

## 2019-11-10 LAB — LIPID PANEL
Cholesterol: 189 mg/dL (ref 0–200)
HDL: 62.1 mg/dL (ref >39.00)
LDL Cholesterol: 111 mg/dL — ABNORMAL HIGH (ref 0–99)
NonHDL: 127.37
Total CHOL/HDL Ratio: 3
Triglycerides: 80 mg/dL (ref 0.0–149.0)
VLDL: 16 mg/dL (ref 0.0–40.0)

## 2019-11-10 LAB — CBC
HCT: 40.2 % (ref 36.0–46.0)
Hemoglobin: 13.1 g/dL (ref 12.0–15.0)
MCHC: 32.6 g/dL (ref 30.0–36.0)
MCV: 81.2 fl (ref 78.0–100.0)
Platelets: 236 10*3/uL (ref 150.0–400.0)
RBC: 4.96 Mil/uL (ref 3.87–5.11)
RDW: 15.7 % — ABNORMAL HIGH (ref 11.5–15.5)
WBC: 9.2 10*3/uL (ref 4.0–10.5)

## 2019-11-10 LAB — HEMOGLOBIN A1C: Hgb A1c MFr Bld: 6.4 % (ref 4.6–6.5)

## 2019-11-11 DIAGNOSIS — R7303 Prediabetes: Secondary | ICD-10-CM

## 2019-11-11 HISTORY — DX: Prediabetes: R73.03

## 2019-11-11 NOTE — Progress Notes (Signed)
Received her labs as follows, letter to pt  Results for orders placed or performed in visit on 11/10/19  Lipid panel  Result Value Ref Range   Cholesterol 189 0 - 200 mg/dL   Triglycerides 21.1 0.0 - 149.0 mg/dL   HDL 17.35 >67.01 mg/dL   VLDL 41.0 0.0 - 30.1 mg/dL   LDL Cholesterol 314 (H) 0 - 99 mg/dL   Total CHOL/HDL Ratio 3    NonHDL 127.37   Hemoglobin A1c  Result Value Ref Range   Hgb A1c MFr Bld 6.4 4.6 - 6.5 %  Comprehensive metabolic panel  Result Value Ref Range   Sodium 142 135 - 145 mEq/L   Potassium 3.8 3.5 - 5.1 mEq/L   Chloride 105 96 - 112 mEq/L   CO2 31 19 - 32 mEq/L   Glucose, Bld 90 70 - 99 mg/dL   BUN 15 6 - 23 mg/dL   Creatinine, Ser 3.88 0.40 - 1.20 mg/dL   Total Bilirubin 0.4 0.2 - 1.2 mg/dL   Alkaline Phosphatase 96 39 - 117 U/L   AST 14 0 - 37 U/L   ALT 12 0 - 35 U/L   Total Protein 6.2 6.0 - 8.3 g/dL   Albumin 3.9 3.5 - 5.2 g/dL   GFR 87.57 >97.28 mL/min   Calcium 9.1 8.4 - 10.5 mg/dL  CBC  Result Value Ref Range   WBC 9.2 4.0 - 10.5 K/uL   RBC 4.96 3.87 - 5.11 Mil/uL   Platelets 236.0 150.0 - 400.0 K/uL   Hemoglobin 13.1 12.0 - 15.0 g/dL   HCT 20.6 01.5 - 61.5 %   MCV 81.2 78.0 - 100.0 fl   MCHC 32.6 30.0 - 36.0 g/dL   RDW 37.9 (H) 43.2 - 76.1 %   Cholesterol is slightly improved from previous  A1c is now in prediabetes range, this is new

## 2019-11-17 ENCOUNTER — Other Ambulatory Visit: Payer: Self-pay
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
## 2019-11-22 ENCOUNTER — Ambulatory Visit (INDEPENDENT_AMBULATORY_CARE_PROVIDER_SITE_OTHER): Payer: PPO | Admitting: Family Medicine

## 2019-11-22 ENCOUNTER — Other Ambulatory Visit: Payer: Self-pay

## 2019-11-22 ENCOUNTER — Encounter: Payer: Self-pay | Admitting: Family Medicine

## 2019-11-22 VITALS — BP 126/70 | HR 72 | Temp 97.0°F | Resp 17 | Ht 65.0 in | Wt 168.0 lb

## 2019-11-22 DIAGNOSIS — R7303 Prediabetes: Secondary | ICD-10-CM

## 2019-11-22 DIAGNOSIS — M810 Age-related osteoporosis without current pathological fracture: Secondary | ICD-10-CM

## 2019-11-22 DIAGNOSIS — Z23 Encounter for immunization: Secondary | ICD-10-CM | POA: Diagnosis not present

## 2019-11-22 DIAGNOSIS — F329 Major depressive disorder, single episode, unspecified: Secondary | ICD-10-CM

## 2019-11-22 DIAGNOSIS — R7989 Other specified abnormal findings of blood chemistry: Secondary | ICD-10-CM

## 2019-11-22 LAB — TSH: TSH: 2.7 u[IU]/mL (ref 0.35–4.50)

## 2019-11-22 MED ORDER — METFORMIN HCL 500 MG PO TABS: 500.0000 mg | ORAL_TABLET | Freq: Every day | ORAL | 3 refills | Status: DC

## 2019-11-22 NOTE — Patient Instructions (Addendum)
Great to see you today!  We will check your thyroid to see if this may be why you are feeling tired You got your 2nd pneumonia shot today We will look into prolia for you  Try metformin for blood sugar control- start with a 1/2 tablet,  You may have some loose stools and gas.  This should resolve with uses- can increase to a whole tablet if tolerated  Please see me in 6 months

## 2019-11-29 ENCOUNTER — Other Ambulatory Visit: Payer: Self-pay

## 2019-11-29 DIAGNOSIS — J302 Other seasonal allergic rhinitis: Secondary | ICD-10-CM

## 2019-11-29 MED ORDER — CETIRIZINE HCL 10 MG PO TABS: ORAL_TABLET | ORAL | 1 refills | Status: DC

## 2020-01-06 ENCOUNTER — Telehealth: Payer: Self-pay | Admitting: Family Medicine

## 2020-01-06 NOTE — Telephone Encounter (Signed)
Patient had 1st Covid shot yesterday and had a reaction. Pain And swelling on her  neck arms ears all last night. She states today it has gotten better. She wanted Dr. Patsy Lager to be aware and to please call her and advise her about 2nd shot

## 2020-01-06 NOTE — Telephone Encounter (Signed)
Called her back- she developed soreness all up and down her arm and minimal lip/ tongue swelling possibly in the day or so after her shot.  This is now improved and she has no difficulty breathing.  I advised her that this may indicate a true allergic reaction and she should NOT have a 2nd dose.  She will use benadryl as needed and seek care if any worsening.  She states understanding

## 2020-01-20 ENCOUNTER — Telehealth: Payer: Self-pay

## 2020-01-20 NOTE — Telephone Encounter (Signed)
Left message to return call. Patient needs appointment in office to be evaluated as long as asymptomatic for Covid-19.

## 2020-01-20 NOTE — Telephone Encounter (Signed)
Nurse Assessment Nurse: Suezanne Jacquet, RN, Riley Lam Date/Time Cathy Gallegos Time): 01/19/2020 5:46:54 PM Confirm and document reason for call. If symptomatic, describe symptoms. ---Has had weakness in legs and had a fall a 3 days ago. Right wrist, right hip, right knee and right ankle and left side of neck is swollen and uncomfortable. Haven't been walking very much but can walk on the ankle it hurts walk on it. Has the patient had close contact with a person known or suspected to have the novel coronavirus illness OR traveled / lives in area with major community spread (including international travel) in the last 14 days from the onset of symptoms? * If Asymptomatic, screen for exposure and travel within the last 14 days. ---No Does the patient have any new or worsening symptoms? ---Yes Will a triage be completed? ---Yes Related visit to physician within the last 2 weeks? ---No Does the PT have any chronic conditions? (i.e. diabetes, asthma, this includes High risk factors for pregnancy, etc.) ---No Is this a behavioral health or substance abuse call? ---No Guidelines Guideline Title Affirmed Question Affirmed Notes Nurse Date/Time (Eastern Time) Weakness (Generalized) and Fatigue [1] SEVERE weakness (i.e., unable to walk or barely able to walk, requires support) Suezanne Jacquet, RN, Riley Lam 01/19/2020 5:49:51 PMPLEASE NOTE: All timestamps contained within this report are represented as [place] Standard Time. CONFIDENTIALTY NOTICE: This fax transmission is intended only for the addressee. It contains information that is legally privileged, confidential or otherwise protected from use or disclosure. If you are not the intended recipient, you are strictly prohibited from reviewing, disclosing, copying using or disseminating any of this information or taking any action in reliance on or regarding this information. If you have received this fax in error, please notify us immediately by telephone so that we can  arrange for its return to [place]. Phone: [phone], Toll-Free: [phone], Fax: [phone] Page: 2 of 2 Call Id: [id number] Guidelines Guideline Title Affirmed Question Affirmed Notes Nurse Date/Time Cathy Gallegos Time) AND [2] new onset or worsening Disp. Time Cathy Gallegos Time) Disposition Final User 01/19/2020 5:53:31 PM Call EMS 911 Now Yes Suezanne Jacquet, RN, Riley Lam  Comments User: Cameron Proud, RN Date/Time Cathy Gallegos Time): 01/19/2020 5:55:24 PM Patient refuses to go to the ER states she will call the office tomorrow. User: Cameron Proud, RN Date/Time Cathy Gallegos Time): 01/19/2020 5:56:20 PM Patient hasn't been out of the bed since Monday says she is too weak. Referrals REFERRED TO PCP OFFICE

## 2020-03-07 ENCOUNTER — Ambulatory Visit: Payer: PPO | Admitting: *Deleted
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
## 2020-03-15 ENCOUNTER — Other Ambulatory Visit: Payer: Self-pay

## 2020-03-15 ENCOUNTER — Ambulatory Visit: Payer: PPO | Admitting: *Deleted
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
## 2020-03-30 ENCOUNTER — Ambulatory Visit: Payer: PPO | Admitting: Family Medicine
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
## 2020-04-04 NOTE — Patient Instructions (Addendum)
It was nice to see you again today!  I will be in touch with your urine culture and average blood sugar asap  Let's visit in 6 months

## 2020-04-05 ENCOUNTER — Other Ambulatory Visit: Payer: Self-pay

## 2020-04-05 ENCOUNTER — Ambulatory Visit (INDEPENDENT_AMBULATORY_CARE_PROVIDER_SITE_OTHER): Payer: PPO | Admitting: Family Medicine

## 2020-04-05 ENCOUNTER — Encounter: Payer: Self-pay | Admitting: Family Medicine

## 2020-04-05 VITALS — BP 124/80 | HR 76 | Temp 98.0°F | Resp 16 | Ht 65.0 in | Wt 170.0 lb

## 2020-04-05 DIAGNOSIS — M810 Age-related osteoporosis without current pathological fracture: Secondary | ICD-10-CM

## 2020-04-05 DIAGNOSIS — R7303 Prediabetes: Secondary | ICD-10-CM | POA: Diagnosis not present

## 2020-04-05 DIAGNOSIS — N39498 Other specified urinary incontinence: Secondary | ICD-10-CM | POA: Diagnosis not present

## 2020-04-05 LAB — HEMOGLOBIN A1C: Hgb A1c MFr Bld: 5.8 % (ref 4.6–6.5)

## 2020-04-07 LAB — URINE CULTURE
MICRO NUMBER:: 10704040
SPECIMEN QUALITY:: ADEQUATE

## 2020-04-07 MED ORDER — CEPHALEXIN 500 MG PO CAPS: 500.0000 mg | ORAL_CAPSULE | Freq: Two times a day (BID) | ORAL | 0 refills | Status: DC

## 2020-04-07 NOTE — Addendum Note (Signed)
Addended by: Abbe Amsterdam C on: 04/07/2020 02:41 PM   Modules accepted: Orders

## 2020-07-07 ENCOUNTER — Other Ambulatory Visit: Payer: Self-pay

## 2020-07-07 ENCOUNTER — Ambulatory Visit (INDEPENDENT_AMBULATORY_CARE_PROVIDER_SITE_OTHER): Payer: PPO | Admitting: Family

## 2020-07-07 VITALS — BP 142/71 | HR 79 | Temp 98.5°F | Resp 16 | Ht 65.0 in | Wt 170.0 lb

## 2020-07-07 DIAGNOSIS — M545 Low back pain, unspecified: Secondary | ICD-10-CM

## 2020-07-07 DIAGNOSIS — R0789 Other chest pain: Secondary | ICD-10-CM | POA: Diagnosis not present

## 2020-07-07 DIAGNOSIS — F32A Depression, unspecified: Secondary | ICD-10-CM | POA: Diagnosis not present

## 2020-07-07 DIAGNOSIS — Z23 Encounter for immunization: Secondary | ICD-10-CM | POA: Diagnosis not present

## 2020-07-07 DIAGNOSIS — M2550 Pain in unspecified joint: Secondary | ICD-10-CM | POA: Diagnosis not present

## 2020-07-07 NOTE — Patient Instructions (Addendum)
Please complete lab work prior to leaving.  Please add tylenol 1000mg  twice daily for your back and joint pain. You will be contacted about scheduling your appointment for physical therapy. Please go to the ER if you develop worsening chest tightness of shortness of breath.  Please schedule follow up with Dr. to further discuss your depression treatment.
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
## 2020-07-10 ENCOUNTER — Encounter: Payer: Self-pay | Admitting: Family

## 2020-07-10 LAB — SEDIMENTATION RATE: Sed Rate: 6 mm/h (ref 0–30)

## 2020-07-10 LAB — RHEUMATOID FACTOR: Rheumatoid fact SerPl-aCnc: <14 IU/mL (ref <14)

## 2020-07-10 LAB — ANA: Anti Nuclear Antibody (ANA): NEGATIVE

## 2020-07-10 NOTE — Progress Notes (Signed)
Mailed out to patient 

## 2020-07-13 ENCOUNTER — Other Ambulatory Visit: Payer: Self-pay

## 2020-07-13 DIAGNOSIS — G4733 Obstructive sleep apnea (adult) (pediatric): Secondary | ICD-10-CM | POA: Insufficient documentation

## 2020-07-13 DIAGNOSIS — F32A Depression, unspecified: Secondary | ICD-10-CM | POA: Insufficient documentation

## 2020-07-13 DIAGNOSIS — T7840XA Allergy, unspecified, initial encounter: Secondary | ICD-10-CM | POA: Insufficient documentation
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
## 2020-07-14 ENCOUNTER — Encounter: Payer: Self-pay | Admitting: Cardiology

## 2020-07-14 ENCOUNTER — Other Ambulatory Visit: Payer: Self-pay

## 2020-07-14 ENCOUNTER — Ambulatory Visit (INDEPENDENT_AMBULATORY_CARE_PROVIDER_SITE_OTHER): Payer: PPO | Admitting: Cardiology

## 2020-07-14 VITALS — BP 148/80 | HR 72 | Ht 65.0 in | Wt 167.7 lb

## 2020-07-14 DIAGNOSIS — R7303 Prediabetes: Secondary | ICD-10-CM | POA: Diagnosis not present

## 2020-07-14 DIAGNOSIS — R079 Chest pain, unspecified: Secondary | ICD-10-CM | POA: Diagnosis not present

## 2020-07-14 DIAGNOSIS — I700 Atherosclerosis of aorta: Secondary | ICD-10-CM

## 2020-07-14 MED ORDER — METOPROLOL TARTRATE 100 MG PO TABS: 100.0000 mg | ORAL_TABLET | Freq: Once | ORAL | 0 refills | Status: DC

## 2020-07-14 NOTE — Patient Instructions (Addendum)
Medication Instructions:  Your physician recommends that you continue on your current medications as directed. Please refer to the Current Medication list given to you today.  *If you need a refill on your cardiac medications before your next appointment, please call your pharmacy*   Lab Work: Your physician recommends that you return for lab work in: TODAY BMP If you have labs (blood work) drawn today and your tests are completely normal, you will receive your results only by: [name] MyChart Message (if you have MyChart) OR . A paper copy in the mail If you have any lab test that is abnormal or we need to change your treatment, we will call you to review the results.   Testing/Procedures: Your cardiac CT will be scheduled at the below location:   [facility] [address] [place], [place] [address] [phone]  If scheduled at [facility], please arrive at the [facility] main entrance of [facility] 30 minutes prior to test start time. Proceed to the [facility] Radiology Department (first floor) to check-in and test prep.   Please follow these instructions carefully (unless otherwise directed):   On the Night Before the Test: . Be sure to Drink plenty of water. . Do not consume any caffeinated/decaffeinated beverages or chocolate 12 hours prior to your test. . Do not take any antihistamines 12 hours prior to your test.  On the Day of the Test: . Drink plenty of water. Do not drink any water within one hour of the test. . Do not eat any food 4 hours prior to the test. . You may take your regular medications prior to the test.  . Take metoprolol (Lopressor) two hours prior to test. . FEMALES- please wear underwire-free bra if available        After the Test: . Drink plenty of water. . After receiving IV contrast, you may experience a mild flushed feeling. This is normal. . On occasion, you may experience a mild rash up to 24 hours after the  test. This is not dangerous. If this occurs, you can take Benadryl 25 mg and increase your fluid intake. . If you experience trouble breathing, this can be serious. If it is severe call 911 IMMEDIATELY. If it is mild, please call our office. . If you take any of these medications: Glipizide/Metformin, Avandament, Glucavance, please do not take 48 hours after completing test unless otherwise instructed.   Once we have confirmed authorization from your insurance company, we will call you to set up a date and time for your test. Based on how quickly your insurance processes prior authorizations requests, please allow up to 4 weeks to be contacted for scheduling your Cardiac CT appointment. Be advised that routine Cardiac CT appointments could be scheduled as many as 8 weeks after your provider has ordered it.  For non-scheduling related questions, please contact the cardiac imaging nurse navigator should you have any questions/concerns: Marchia Bond, Cardiac Imaging Nurse Navigator Burley Saver, Interim Cardiac Imaging Nurse [facility] Direct Office Dial: [phone]   For scheduling needs, including cancellations and rescheduling, please call Vivien Rota at [phone], option 3.      Follow-Up: At [facility], you and your health needs are our priority.  As part of our continuing mission to provide you with exceptional heart care, we have created designated Provider Care Teams.  These Care Teams include your primary Cardiologist (physician) and Advanced Practice Providers (APPs -  Physician Assistants  and Nurse Practitioners) who all work together to provide you with the care you need, when you need it.  We recommend signing up for the patient portal called "MyChart".  Sign up information is provided on this After Visit Summary.  MyChart is used to connect with patients for Virtual Visits (Telemedicine).  Patients are able to view lab/test results, encounter  notes, upcoming appointments, etc.  Non-urgent messages can be sent to your provider as well.   To learn more about what you can do with MyChart, go to [email or web address].    Your next appointment:   6 week(s)  The format for your next appointment:   In Person  Provider:   Shirlee More, MD   Other Instructions

## 2020-07-15 LAB — BASIC METABOLIC PANEL
BUN/Creatinine Ratio: 16 (ref 12–28)
BUN: 12 mg/dL (ref 8–27)
CO2: 24 mmol/L (ref 20–29)
Calcium: 9.4 mg/dL (ref 8.7–10.3)
Chloride: 103 mmol/L (ref 96–106)
Creatinine, Ser: 0.74 mg/dL (ref 0.57–1.00)
GFR calc Af Amer: 96 mL/min/{1.73_m2} (ref >59)
GFR calc non Af Amer: 83 mL/min/{1.73_m2} (ref >59)
Glucose: 75 mg/dL (ref 65–99)
Potassium: 4 mmol/L (ref 3.5–5.2)
Sodium: 143 mmol/L (ref 134–144)

## 2020-07-15 NOTE — Patient Instructions (Addendum)
It was very nice to see you again today! I will be in touch with your labs Try the Myrbetriq for your bladder symptoms- let me know how it works for you We will set you up for a lumbar MRI as well  Please keep me posted about your mood symptoms let me now if you are not doing ok
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
## 2020-07-17 ENCOUNTER — Ambulatory Visit (INDEPENDENT_AMBULATORY_CARE_PROVIDER_SITE_OTHER): Payer: PPO | Admitting: Family Medicine

## 2020-07-17 ENCOUNTER — Other Ambulatory Visit: Payer: Self-pay

## 2020-07-17 ENCOUNTER — Encounter: Payer: Self-pay | Admitting: Family Medicine

## 2020-07-17 VITALS — BP 138/90 | HR 68 | Resp 16 | Ht 65.0 in | Wt 169.0 lb

## 2020-07-17 DIAGNOSIS — F32A Depression, unspecified: Secondary | ICD-10-CM | POA: Diagnosis not present

## 2020-07-17 DIAGNOSIS — Z13220 Encounter for screening for lipoid disorders: Secondary | ICD-10-CM

## 2020-07-17 DIAGNOSIS — R7303 Prediabetes: Secondary | ICD-10-CM

## 2020-07-17 DIAGNOSIS — M545 Low back pain, unspecified: Secondary | ICD-10-CM | POA: Diagnosis not present

## 2020-07-17 DIAGNOSIS — N39498 Other specified urinary incontinence: Secondary | ICD-10-CM

## 2020-07-17 MED ORDER — MIRABEGRON ER 25 MG PO TB24: 25.0000 mg | ORAL_TABLET | Freq: Every day | ORAL | 6 refills | Status: DC

## 2020-07-18 ENCOUNTER — Telehealth: Payer: Self-pay

## 2020-07-18 LAB — LIPID PANEL
Cholesterol: 183 mg/dL (ref <200)
HDL: 58 mg/dL (ref >=50)
LDL Cholesterol (Calc): 108 mg/dL (calc) — ABNORMAL HIGH
Non-HDL Cholesterol (Calc): 125 mg/dL (calc) (ref <130)
Total CHOL/HDL Ratio: 3.2 (calc) (ref <5.0)
Triglycerides: 79 mg/dL (ref <150)

## 2020-07-18 LAB — HEMOGLOBIN A1C
Hgb A1c MFr Bld: 5.7 % of total Hgb — ABNORMAL HIGH (ref <5.7)
Mean Plasma Glucose: 117 (calc)
eAG (mmol/L): 6.5 (calc)

## 2020-07-18 LAB — CBC
HCT: 38.1 % (ref 35.0–45.0)
Hemoglobin: 12.3 g/dL (ref 11.7–15.5)
MCH: 26 pg — ABNORMAL LOW (ref 27.0–33.0)
MCHC: 32.3 g/dL (ref 32.0–36.0)
MCV: 80.5 fL (ref 80.0–100.0)
MPV: 12.3 fL (ref 7.5–12.5)
Platelets: 250 10*3/uL (ref 140–400)
RBC: 4.73 10*6/uL (ref 3.80–5.10)
RDW: 14.1 % (ref 11.0–15.0)
WBC: 7.1 10*3/uL (ref 3.8–10.8)

## 2020-07-18 LAB — TSH: TSH: 2.97 mIU/L (ref 0.40–4.50)

## 2020-07-18 NOTE — Telephone Encounter (Signed)
Left message on patients voicemail to please return our call.   

## 2020-07-19 NOTE — Telephone Encounter (Signed)
Spoke with patient regarding results and recommendation.  Patient verbalizes understanding and is agreeable to plan of care. Advised patient to call back with any issues or concerns.  

## 2020-08-07 ENCOUNTER — Ambulatory Visit: Payer: PPO | Attending: Family | Admitting: Physical Therapy

## 2020-08-07 ENCOUNTER — Encounter: Payer: Self-pay | Admitting: Physical Therapy

## 2020-08-07 ENCOUNTER — Other Ambulatory Visit: Payer: Self-pay

## 2020-08-07 DIAGNOSIS — M5441 Lumbago with sciatica, right side: Secondary | ICD-10-CM | POA: Diagnosis not present

## 2020-08-07 DIAGNOSIS — M5442 Lumbago with sciatica, left side: Secondary | ICD-10-CM | POA: Insufficient documentation

## 2020-08-07 DIAGNOSIS — G8929 Other chronic pain: Secondary | ICD-10-CM | POA: Diagnosis not present

## 2020-08-07 DIAGNOSIS — R262 Difficulty in walking, not elsewhere classified: Secondary | ICD-10-CM | POA: Insufficient documentation

## 2020-08-07 DIAGNOSIS — R29898 Other symptoms and signs involving the musculoskeletal system: Secondary | ICD-10-CM | POA: Insufficient documentation
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
## 2020-08-23 ENCOUNTER — Ambulatory Visit: Payer: PPO | Admitting: Physical Therapy

## 2020-08-29 ENCOUNTER — Telehealth: Payer: Self-pay | Admitting: Cardiology

## 2020-08-29 ENCOUNTER — Encounter: Payer: PPO | Admitting: Physical Therapy

## 2020-08-29 DIAGNOSIS — R0789 Other chest pain: Secondary | ICD-10-CM

## 2020-08-29 NOTE — Telephone Encounter (Signed)
Spoke to the patient just now who let me know that she has not had her cardiac CT completed as of yet. I let her know that I am going to contact the ct schedulers to see if they can get this scheduled for her as soon as possible.

## 2020-08-29 NOTE — Telephone Encounter (Signed)
Patient cancelled appointment previously scheduled for 08/30/20 as she assumes it is no longer necessary. She requested a call back to confirm whether or not she will need to reschedule. Please advise.

## 2020-08-30 ENCOUNTER — Ambulatory Visit: Payer: PPO | Admitting: Cardiology

## 2020-09-01 ENCOUNTER — Other Ambulatory Visit: Payer: Self-pay | Admitting: Family Medicine

## 2020-09-01 DIAGNOSIS — F329 Major depressive disorder, single episode, unspecified: Secondary | ICD-10-CM

## 2020-09-06 ENCOUNTER — Encounter: Payer: PPO | Admitting: Physical Therapy

## 2020-09-13 ENCOUNTER — Encounter: Payer: PPO | Admitting: Physical Therapy

## 2020-09-19 ENCOUNTER — Telehealth (HOSPITAL_COMMUNITY): Payer: Self-pay | Admitting: Emergency Medicine

## 2020-09-19 ENCOUNTER — Telehealth (HOSPITAL_COMMUNITY): Payer: Self-pay | Admitting: *Deleted

## 2020-09-19 ENCOUNTER — Other Ambulatory Visit (HOSPITAL_COMMUNITY): Payer: Self-pay | Admitting: *Deleted

## 2020-09-19 ENCOUNTER — Other Ambulatory Visit: Payer: Self-pay

## 2020-09-19 DIAGNOSIS — R0789 Other chest pain: Secondary | ICD-10-CM

## 2020-09-19 NOTE — Progress Notes (Signed)
Placing BMET order for Cardiac CT.

## 2020-09-19 NOTE — Telephone Encounter (Signed)
Reaching out to patient to offer assistance regarding upcoming cardiac imaging study; pt verbalizes understanding of appt date/time, parking situation and where to check in, pre-test NPO status and medications ordered, and verified current allergies; name and call back number provided for further questions should they arise ° °Jamil Castillo Tai RN Navigator Cardiac Imaging °[facility] °[phone] office °[phone] cell ° °

## 2020-09-19 NOTE — Telephone Encounter (Signed)
Attempted to call patient and review CCTA instructions. Pt was driving and requested a call after lunch.   Pt stated she was headed to [facility] heartcare office to lab draw  Rockwell Alexandria RN Navigator Cardiac Imaging [facility] [phone] Office  [phone] Cell

## 2020-09-20 ENCOUNTER — Telehealth: Payer: Self-pay

## 2020-09-20 LAB — BASIC METABOLIC PANEL
BUN/Creatinine Ratio: 23 (ref 12–28)
BUN: 18 mg/dL (ref 8–27)
CO2: 23 mmol/L (ref 20–29)
Calcium: 9.5 mg/dL (ref 8.7–10.3)
Chloride: 104 mmol/L (ref 96–106)
Creatinine, Ser: 0.79 mg/dL (ref 0.57–1.00)
GFR calc Af Amer: 88 mL/min/{1.73_m2} (ref >59)
GFR calc non Af Amer: 76 mL/min/{1.73_m2} (ref >59)
Glucose: 89 mg/dL (ref 65–99)
Potassium: 4 mmol/L (ref 3.5–5.2)
Sodium: 142 mmol/L (ref 134–144)

## 2020-09-20 NOTE — Telephone Encounter (Signed)
Spoke with patient regarding results and recommendation.  Patient verbalizes understanding and is agreeable to plan of care. Advised patient to call back with any issues or concerns.  

## 2020-09-21 ENCOUNTER — Ambulatory Visit (HOSPITAL_COMMUNITY)
Admission: RE | Admit: 2020-09-21 | Discharge: 2020-09-21 | Disposition: A | Discharge status: Home / Self Care | Payer: PPO | Source: Ambulatory Visit | Attending: Cardiology | Admitting: Cardiology

## 2020-09-21 ENCOUNTER — Other Ambulatory Visit: Payer: Self-pay

## 2020-09-21 ENCOUNTER — Telehealth: Payer: Self-pay

## 2020-09-21 DIAGNOSIS — R7303 Prediabetes: Secondary | ICD-10-CM | POA: Diagnosis not present

## 2020-09-21 DIAGNOSIS — I700 Atherosclerosis of aorta: Secondary | ICD-10-CM | POA: Diagnosis not present

## 2020-09-21 DIAGNOSIS — K449 Diaphragmatic hernia without obstruction or gangrene: Secondary | ICD-10-CM | POA: Insufficient documentation

## 2020-09-21 DIAGNOSIS — R0789 Other chest pain: Secondary | ICD-10-CM | POA: Insufficient documentation

## 2020-09-21 DIAGNOSIS — R079 Chest pain, unspecified: Secondary | ICD-10-CM | POA: Diagnosis not present

## 2020-09-21 MED ORDER — NITROGLYCERIN 0.4 MG SL SUBL
SUBLINGUAL_TABLET | SUBLINGUAL | Status: AC
  Filled 2020-09-21: qty 2

## 2020-09-21 MED ORDER — NITROGLYCERIN 0.4 MG SL SUBL
0.8000 mg | SUBLINGUAL_TABLET | Freq: Once | SUBLINGUAL | Status: AC
  Administered 2020-09-21: 14:00:00 0.8 mg via SUBLINGUAL

## 2020-09-21 MED ORDER — PANTOPRAZOLE SODIUM 40 MG PO TBEC: 40.0000 mg | DELAYED_RELEASE_TABLET | Freq: Every day | ORAL | 3 refills | Status: DC

## 2020-09-21 MED ORDER — IOHEXOL 350 MG/ML SOLN
80.0000 mL | Freq: Once | INTRAVENOUS | Status: AC | PRN
  Administered 2020-09-21: 80 mL via INTRAVENOUS

## 2020-09-21 NOTE — Telephone Encounter (Signed)
Spoke with patient regarding results and recommendation.  Patient verbalizes understanding and is agreeable to plan of care. Advised patient to call back with any issues or concerns.  
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
## 2020-10-23 NOTE — Patient Instructions (Addendum)
Good to see you again today!  I will be in touch with your urine culture but we will go ahead and treat you for presumed UTI now with macrobid Please go to the ground floor imaging and have a right ankle x-ray; I will be in touch with this report Assuming no fracture I think you have a bad sprain!   Try a lace-up "sweedo" type ankle brace like we talked about.  Wear supportive shoes such as tennis shoes If you are not improving with this regimen in a week or so I will send you to physical therapy to work on your ankle

## 2020-10-25 ENCOUNTER — Ambulatory Visit (INDEPENDENT_AMBULATORY_CARE_PROVIDER_SITE_OTHER): Payer: PPO | Admitting: Family Medicine

## 2020-10-25 ENCOUNTER — Other Ambulatory Visit: Payer: Self-pay

## 2020-10-25 ENCOUNTER — Encounter: Payer: Self-pay | Admitting: Family Medicine

## 2020-10-25 ENCOUNTER — Ambulatory Visit (HOSPITAL_BASED_OUTPATIENT_CLINIC_OR_DEPARTMENT_OTHER)
Admission: RE | Admit: 2020-10-25 | Discharge: 2020-10-25 | Disposition: A | Discharge status: Home / Self Care | Payer: PPO | Source: Ambulatory Visit | Attending: Family Medicine | Admitting: Family Medicine

## 2020-10-25 VITALS — BP 122/82 | HR 83 | Temp 98.0°F | Resp 17 | Ht 65.0 in | Wt 170.0 lb

## 2020-10-25 DIAGNOSIS — M7989 Other specified soft tissue disorders: Secondary | ICD-10-CM | POA: Diagnosis not present

## 2020-10-25 DIAGNOSIS — S93491A Sprain of other ligament of right ankle, initial encounter: Secondary | ICD-10-CM | POA: Diagnosis not present

## 2020-10-25 DIAGNOSIS — R300 Dysuria: Secondary | ICD-10-CM

## 2020-10-25 DIAGNOSIS — R319 Hematuria, unspecified: Secondary | ICD-10-CM | POA: Diagnosis not present

## 2020-10-25 LAB — POCT URINALYSIS DIP (MANUAL ENTRY)
Bilirubin, UA: NEGATIVE
Glucose, UA: NEGATIVE mg/dL
Ketones, POC UA: NEGATIVE mg/dL
Nitrite, UA: NEGATIVE
Protein Ur, POC: 100 mg/dL — AB
Spec Grav, UA: >=1.03 — AB (ref 1.010–1.025)
Urobilinogen, UA: 0.2 E.U./dL
pH, UA: 6 (ref 5.0–8.0)

## 2020-10-25 LAB — URINALYSIS, MICROSCOPIC ONLY

## 2020-10-25 MED ORDER — NITROFURANTOIN MONOHYD MACRO 100 MG PO CAPS: 100.0000 mg | ORAL_CAPSULE | Freq: Two times a day (BID) | ORAL | 0 refills | Status: DC

## 2020-10-27 LAB — URINE CULTURE
MICRO NUMBER:: 11486764
SPECIMEN QUALITY:: ADEQUATE

## 2020-12-20 ENCOUNTER — Other Ambulatory Visit: Payer: Self-pay | Admitting: Family Medicine

## 2020-12-20 DIAGNOSIS — R7303 Prediabetes: Secondary | ICD-10-CM

## 2021-01-04 ENCOUNTER — Telehealth: Payer: Self-pay | Admitting: Family Medicine

## 2021-01-04 NOTE — Telephone Encounter (Signed)
Patient currently taking cetirizine 10 mg daily as needed for allergies.

## 2021-01-04 NOTE — Telephone Encounter (Signed)
Called her back- no answer and VM is full, she does not have mychart

## 2021-01-04 NOTE — Telephone Encounter (Signed)
Patient states she feels like her allergy med is no longer working. And wants to know if [facility] thinks she should try something else. And if she can call it in to the Pharm for her.

## 2021-01-08 NOTE — Telephone Encounter (Signed)
Called her back and spoke with her -she is currently taking Zyrtec.  I suggested she try changing to Allegra, also might add a nasal steroid spray.  She will try these changes and let me know how she does

## 2021-03-13 ENCOUNTER — Encounter: Payer: Self-pay | Admitting: Family

## 2021-03-13 ENCOUNTER — Other Ambulatory Visit: Payer: Self-pay

## 2021-03-13 ENCOUNTER — Ambulatory Visit (INDEPENDENT_AMBULATORY_CARE_PROVIDER_SITE_OTHER): Payer: PPO | Admitting: Family

## 2021-03-13 VITALS — BP 130/80 | HR 74 | Temp 97.8°F | Ht 64.0 in | Wt 166.2 lb

## 2021-03-13 DIAGNOSIS — J309 Allergic rhinitis, unspecified: Secondary | ICD-10-CM | POA: Diagnosis not present

## 2021-03-13 DIAGNOSIS — R300 Dysuria: Secondary | ICD-10-CM

## 2021-03-13 DIAGNOSIS — R42 Dizziness and giddiness: Secondary | ICD-10-CM | POA: Diagnosis not present

## 2021-03-13 LAB — CBC WITH DIFFERENTIAL/PLATELET
Basophils Absolute: 0 10*3/uL (ref 0.0–0.1)
Basophils Relative: 0.3 % (ref 0.0–3.0)
Eosinophils Absolute: 0.2 10*3/uL (ref 0.0–0.7)
Eosinophils Relative: 2.5 % (ref 0.0–5.0)
HCT: 39.2 % (ref 36.0–46.0)
Hemoglobin: 12.8 g/dL (ref 12.0–15.0)
Lymphocytes Relative: 32.7 % (ref 12.0–46.0)
Lymphs Abs: 2.4 10*3/uL (ref 0.7–4.0)
MCHC: 32.6 g/dL (ref 30.0–36.0)
MCV: 79.6 fl (ref 78.0–100.0)
Monocytes Absolute: 0.5 10*3/uL (ref 0.1–1.0)
Monocytes Relative: 6.1 % (ref 3.0–12.0)
Neutro Abs: 4.4 10*3/uL (ref 1.4–7.7)
Neutrophils Relative %: 58.4 % (ref 43.0–77.0)
Platelets: 231 10*3/uL (ref 150.0–400.0)
RBC: 4.92 Mil/uL (ref 3.87–5.11)
RDW: 15.2 % (ref 11.5–15.5)
WBC: 7.5 10*3/uL (ref 4.0–10.5)

## 2021-03-13 LAB — COMPREHENSIVE METABOLIC PANEL
ALT: 11 U/L (ref 0–35)
AST: 13 U/L (ref 0–37)
Albumin: 3.9 g/dL (ref 3.5–5.2)
Alkaline Phosphatase: 90 U/L (ref 39–117)
BUN: 23 mg/dL (ref 6–23)
CO2: 26 mEq/L (ref 19–32)
Calcium: 9.5 mg/dL (ref 8.4–10.5)
Chloride: 106 mEq/L (ref 96–112)
Creatinine, Ser: 0.8 mg/dL (ref 0.40–1.20)
GFR: 74.56 mL/min (ref >60.00)
Glucose, Bld: 96 mg/dL (ref 70–99)
Potassium: 4.2 mEq/L (ref 3.5–5.1)
Sodium: 140 mEq/L (ref 135–145)
Total Bilirubin: 0.4 mg/dL (ref 0.2–1.2)
Total Protein: 6.1 g/dL (ref 6.0–8.3)

## 2021-03-13 LAB — POCT URINALYSIS DIP (CLINITEK)
Bilirubin, UA: NEGATIVE
Glucose, UA: NEGATIVE mg/dL
Ketones, POC UA: NEGATIVE mg/dL
Nitrite, UA: NEGATIVE
POC PROTEIN,UA: 30 — AB
Spec Grav, UA: 1.02 (ref 1.010–1.025)
Urobilinogen, UA: 0.2 E.U./dL
pH, UA: 6 (ref 5.0–8.0)

## 2021-03-13 MED ORDER — AMOXICILLIN-POT CLAVULANATE 875-125 MG PO TABS: 1.0000 | ORAL_TABLET | Freq: Two times a day (BID) | ORAL | 0 refills | Status: AC
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
## 2021-03-15 LAB — URINE CULTURE
MICRO NUMBER:: 12031925
SPECIMEN QUALITY:: ADEQUATE
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
## 2021-04-25 ENCOUNTER — Ambulatory Visit: Payer: PPO | Admitting: Family Medicine

## 2021-05-27 ENCOUNTER — Ambulatory Visit: Payer: PPO
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
## 2021-06-26 NOTE — Patient Instructions (Addendum)
Good to see you today, I will be in touch with your labs asap When you are feeling hungry, try to first eat a healthy low calorie food like vegetables or fruit, and drink some water. This may help you avoid overeating Seeing a counselor to discuss your stressors may also help!   Flu shot given today I ordered your mammogram and bone density- you can stop by the imaging dept on the ground floor today to schedule these I would recommend getting the shingles vaccine at your pharmacy if not done already

## 2021-06-28 ENCOUNTER — Other Ambulatory Visit: Payer: Self-pay

## 2021-06-28 ENCOUNTER — Ambulatory Visit (INDEPENDENT_AMBULATORY_CARE_PROVIDER_SITE_OTHER): Payer: PPO | Admitting: Family Medicine

## 2021-06-28 VITALS — BP 120/60 | HR 71 | Temp 98.0°F | Resp 18 | Ht 64.0 in | Wt 171.6 lb

## 2021-06-28 DIAGNOSIS — R7303 Prediabetes: Secondary | ICD-10-CM

## 2021-06-28 DIAGNOSIS — R32 Unspecified urinary incontinence: Secondary | ICD-10-CM | POA: Diagnosis not present

## 2021-06-28 DIAGNOSIS — F32A Depression, unspecified: Secondary | ICD-10-CM

## 2021-06-28 DIAGNOSIS — Z1231 Encounter for screening mammogram for malignant neoplasm of breast: Secondary | ICD-10-CM | POA: Diagnosis not present

## 2021-06-28 DIAGNOSIS — Z13220 Encounter for screening for lipoid disorders: Secondary | ICD-10-CM | POA: Diagnosis not present

## 2021-06-28 DIAGNOSIS — Z23 Encounter for immunization: Secondary | ICD-10-CM

## 2021-06-28 DIAGNOSIS — J3089 Other allergic rhinitis: Secondary | ICD-10-CM

## 2021-06-28 DIAGNOSIS — M810 Age-related osteoporosis without current pathological fracture: Secondary | ICD-10-CM

## 2021-06-28 DIAGNOSIS — R635 Abnormal weight gain: Secondary | ICD-10-CM | POA: Diagnosis not present

## 2021-06-28 LAB — POCT URINALYSIS DIPSTICK
Blood, UA: NEGATIVE
Glucose, UA: NEGATIVE
Ketones, UA: NEGATIVE
Nitrite, UA: NEGATIVE
Protein, UA: NEGATIVE
Spec Grav, UA: >=1.03 — AB (ref 1.010–1.025)
Urobilinogen, UA: 0.2 E.U./dL
pH, UA: 6 (ref 5.0–8.0)

## 2021-06-29 LAB — LIPID PANEL
Cholesterol: 181 mg/dL (ref 0–200)
HDL: 61.7 mg/dL (ref >39.00)
LDL Cholesterol: 102 mg/dL — ABNORMAL HIGH (ref 0–99)
NonHDL: 119.71
Total CHOL/HDL Ratio: 3
Triglycerides: 89 mg/dL (ref 0.0–149.0)
VLDL: 17.8 mg/dL (ref 0.0–40.0)

## 2021-06-29 LAB — HEMOGLOBIN A1C: Hgb A1c MFr Bld: 5.7 % (ref 4.6–6.5)

## 2021-06-29 LAB — TSH: TSH: 2.48 u[IU]/mL (ref 0.35–5.50)

## 2021-06-30 LAB — URINE CULTURE
MICRO NUMBER:: 12470062
SPECIMEN QUALITY:: ADEQUATE

## 2021-07-01 MED ORDER — AMOXICILLIN 500 MG PO CAPS: 500.0000 mg | ORAL_CAPSULE | Freq: Two times a day (BID) | ORAL | 0 refills | Status: DC

## 2021-07-01 MED ORDER — CETIRIZINE HCL 10 MG PO TABS: 10.0000 mg | ORAL_TABLET | ORAL | 3 refills | Status: DC | PRN

## 2021-07-01 NOTE — Addendum Note (Signed)
Addended by: Abbe Amsterdam C on: 07/01/2021 12:33 PM   Modules accepted: Orders

## 2021-07-06 DIAGNOSIS — H5213 Myopia, bilateral: Secondary | ICD-10-CM | POA: Diagnosis not present

## 2021-07-06 DIAGNOSIS — H2513 Age-related nuclear cataract, bilateral: Secondary | ICD-10-CM | POA: Diagnosis not present

## 2021-07-06 DIAGNOSIS — H25041 Posterior subcapsular polar age-related cataract, right eye: Secondary | ICD-10-CM | POA: Diagnosis not present

## 2021-07-06 DIAGNOSIS — H04123 Dry eye syndrome of bilateral lacrimal glands: Secondary | ICD-10-CM | POA: Diagnosis not present

## 2021-07-24 ENCOUNTER — Ambulatory Visit (HOSPITAL_BASED_OUTPATIENT_CLINIC_OR_DEPARTMENT_OTHER)
Admission: RE | Admit: 2021-07-24 | Discharge: 2021-07-24 | Disposition: A | Discharge status: Home / Self Care | Payer: PPO | Source: Ambulatory Visit | Attending: Family Medicine | Admitting: Family Medicine

## 2021-07-24 ENCOUNTER — Other Ambulatory Visit: Payer: Self-pay

## 2021-07-24 ENCOUNTER — Ambulatory Visit (HOSPITAL_BASED_OUTPATIENT_CLINIC_OR_DEPARTMENT_OTHER): Payer: PPO

## 2021-07-24 ENCOUNTER — Other Ambulatory Visit (HOSPITAL_BASED_OUTPATIENT_CLINIC_OR_DEPARTMENT_OTHER): Payer: PPO

## 2021-07-24 ENCOUNTER — Encounter (HOSPITAL_BASED_OUTPATIENT_CLINIC_OR_DEPARTMENT_OTHER): Payer: Self-pay

## 2021-07-24 DIAGNOSIS — M810 Age-related osteoporosis without current pathological fracture: Secondary | ICD-10-CM | POA: Diagnosis not present

## 2021-07-24 DIAGNOSIS — Z1231 Encounter for screening mammogram for malignant neoplasm of breast: Secondary | ICD-10-CM

## 2021-08-03 ENCOUNTER — Telehealth: Payer: Self-pay

## 2021-08-03 DIAGNOSIS — Z91018 Allergy to other foods: Secondary | ICD-10-CM

## 2021-08-03 NOTE — Telephone Encounter (Signed)
[facility] Night - Client Client Site [facility] - Night Physician Abbe Amsterdam- MD Contact Type Call Who Is Calling Patient / Member / Family / Caregiver Caller Name [name] Caller Phone Number [phone] Patient Name Cathy Gallegos Patient DOB [birth year] Call Type Message Only Information Provided Reason for Call Request for General Office Information Initial Comment Caller states she is calling because she wants to talk to Dr. Patsy Lager. She wants a food allergy test. Additional Comment Office hours provided. Caller wanted to send message. Disp. Time Disposition Final User 08/02/2021 5:20:38 PM General Information Provided Yes Jake Seats Call Closed By: Jake Seats Transaction Date/Time: 08/02/2021 5:18:27 PM (ET

## 2021-08-03 NOTE — Telephone Encounter (Signed)
Please see below.

## 2021-08-07 ENCOUNTER — Other Ambulatory Visit: Payer: Self-pay

## 2021-08-07 NOTE — Addendum Note (Signed)
Addended by: Abbe Amsterdam C on: 08/07/2021 06:39 PM   Modules accepted: Orders

## 2021-08-07 NOTE — Telephone Encounter (Signed)
Pt says she feels some better, she was struggling with her GI system at the time of the call. Still would like a referral to Allergy for testing. Made pt aware of the GI virus that is going around.   - I would place order, unsure what dx code to associate to.

## 2021-08-09 ENCOUNTER — Other Ambulatory Visit (HOSPITAL_BASED_OUTPATIENT_CLINIC_OR_DEPARTMENT_OTHER): Payer: Self-pay | Admitting: Family Medicine

## 2021-08-09 DIAGNOSIS — M810 Age-related osteoporosis without current pathological fracture: Secondary | ICD-10-CM

## 2021-09-13 ENCOUNTER — Other Ambulatory Visit (HOSPITAL_BASED_OUTPATIENT_CLINIC_OR_DEPARTMENT_OTHER): Payer: PPO

## 2021-09-18 ENCOUNTER — Ambulatory Visit (HOSPITAL_BASED_OUTPATIENT_CLINIC_OR_DEPARTMENT_OTHER)
Admission: RE | Admit: 2021-09-18 | Discharge: 2021-09-18 | Disposition: A | Discharge status: Home / Self Care | Payer: PPO | Source: Ambulatory Visit | Attending: Family Medicine | Admitting: Family Medicine

## 2021-09-18 ENCOUNTER — Other Ambulatory Visit: Payer: Self-pay

## 2021-09-18 DIAGNOSIS — M810 Age-related osteoporosis without current pathological fracture: Secondary | ICD-10-CM | POA: Diagnosis not present

## 2021-09-18 DIAGNOSIS — Z780 Asymptomatic menopausal state: Secondary | ICD-10-CM | POA: Diagnosis not present

## 2021-09-18 DIAGNOSIS — M85832 Other specified disorders of bone density and structure, left forearm: Secondary | ICD-10-CM | POA: Diagnosis not present

## 2021-09-24 ENCOUNTER — Encounter: Payer: Self-pay | Admitting: Family Medicine

## 2021-10-09 DIAGNOSIS — H25811 Combined forms of age-related cataract, right eye: Secondary | ICD-10-CM | POA: Diagnosis not present

## 2021-10-09 DIAGNOSIS — H25041 Posterior subcapsular polar age-related cataract, right eye: Secondary | ICD-10-CM | POA: Diagnosis not present

## 2021-10-09 DIAGNOSIS — H2511 Age-related nuclear cataract, right eye: Secondary | ICD-10-CM | POA: Diagnosis not present

## 2021-10-10 ENCOUNTER — Ambulatory Visit: Payer: PPO | Admitting: Allergy

## 2021-10-22 ENCOUNTER — Telehealth: Payer: Self-pay

## 2021-10-22 ENCOUNTER — Other Ambulatory Visit: Payer: Self-pay

## 2021-10-22 DIAGNOSIS — M810 Age-related osteoporosis without current pathological fracture: Secondary | ICD-10-CM

## 2021-10-22 MED ORDER — ALENDRONATE SODIUM 70 MG PO TABS: 70.0000 mg | ORAL_TABLET | ORAL | 11 refills | Status: DC

## 2021-10-22 NOTE — Telephone Encounter (Signed)
Pt aware and voices understanding.   

## 2021-10-22 NOTE — Telephone Encounter (Signed)
Caller Name Mairim Bade Caller Phone Number [phone] Patient Name Dylin Ihnen Patient DOB 06/28/1951 Call Type Message Only Information Provided Reason for Call Request for Lab/Test Results Initial Comment Caller states, calling on Bone Density test results. Disp. Time Disposition Final User 10/22/2021 12:02:53 PM General Information Provided Yes Merrily Pew Call Closed By: Merrily Pew Transaction Date/Time: 10/22/2021 12:00:49 PM (ET)

## 2021-10-22 NOTE — Telephone Encounter (Signed)
Per letter 09/24/21: "I know these reports can be a bit difficult to read!  However, the most important point is that you do have an increased risk of especially hip fracture over the next 10 years due to some loss of bone density.  I would recommend starting on an oral medication such as once weekly Fosamax to strengthen your bones and decrease risk of fracture   Please let me know if you would be interested in a prescription and I can call it in for you.  In any case I would recommend redoing your bone density scan in about 2 years"

## 2021-11-07 ENCOUNTER — Other Ambulatory Visit: Payer: Self-pay

## 2021-11-07 ENCOUNTER — Ambulatory Visit: Payer: PPO | Admitting: Allergy

## 2021-11-07 ENCOUNTER — Encounter: Payer: Self-pay | Admitting: Allergy

## 2021-11-07 VITALS — BP 122/72 | HR 99 | Temp 98.2°F | Resp 18 | Ht 65.0 in | Wt 178.8 lb

## 2021-11-07 DIAGNOSIS — T781XXD Other adverse food reactions, not elsewhere classified, subsequent encounter: Secondary | ICD-10-CM | POA: Diagnosis not present

## 2021-11-07 DIAGNOSIS — R197 Diarrhea, unspecified: Secondary | ICD-10-CM | POA: Diagnosis not present

## 2021-11-07 DIAGNOSIS — L253 Unspecified contact dermatitis due to other chemical products: Secondary | ICD-10-CM

## 2021-11-07 MED ORDER — EPINEPHRINE 0.3 MG/0.3ML IJ SOAJ: 0.3000 mg | INTRAMUSCULAR | 2 refills | Status: AC | PRN
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
## 2021-11-07 NOTE — Patient Instructions (Addendum)
Adverse food reaction  - we have discussed the following in regards to foods:   Allergy: food allergy is when you have eaten a food, developed an allergic reaction after eating the food and have IgE to the food (positive food testing either by skin testing or blood testing).  Food allergy could lead to life threatening symptoms  Sensitivity: occurs when you have IgE to a food (positive food testing either by skin testing or blood testing) but is a food you eat without any issues.  This is not an allergy and we recommend keeping the food in the diet  Intolerance: this is when you have negative testing by either skin testing or blood testing thus not allergic but the food causes symptoms (like belly pain, bloating, diarrhea etc) with ingestion.  These foods should be avoided to prevent symptoms.    - testing for foods today is slightly reactive to salmon - continue avoidance of salmon/fish, caffeine and artifical sugars - we do not have testing for artificial sugars or caffeine thus if these items cause symptoms after eating then best to avoid - have access to self-injectable epinephrine Epipen 0.3mg  at all times - follow emergency action plan in case of allergic reaction  Follow-up in 6-12 months or sooner if needed

## 2021-11-25 ENCOUNTER — Other Ambulatory Visit: Payer: Self-pay | Admitting: Family Medicine

## 2022-01-07 ENCOUNTER — Ambulatory Visit: Payer: PPO | Admitting: Family Medicine

## 2022-01-29 ENCOUNTER — Encounter: Payer: Self-pay | Admitting: Family

## 2022-01-29 ENCOUNTER — Ambulatory Visit (INDEPENDENT_AMBULATORY_CARE_PROVIDER_SITE_OTHER): Payer: PPO | Admitting: Family

## 2022-01-29 VITALS — BP 140/100 | HR 75 | Temp 98.3°F | Ht 65.0 in | Wt 178.2 lb

## 2022-01-29 DIAGNOSIS — R350 Frequency of micturition: Secondary | ICD-10-CM

## 2022-01-29 DIAGNOSIS — L989 Disorder of the skin and subcutaneous tissue, unspecified: Secondary | ICD-10-CM

## 2022-01-29 DIAGNOSIS — M25471 Effusion, right ankle: Secondary | ICD-10-CM | POA: Diagnosis not present
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
## 2022-01-31 ENCOUNTER — Other Ambulatory Visit: Payer: Self-pay | Admitting: Family

## 2022-01-31 LAB — URINE CULTURE
MICRO NUMBER:: 13371350
SPECIMEN QUALITY:: ADEQUATE

## 2022-01-31 MED ORDER — NITROFURANTOIN MONOHYD MACRO 100 MG PO CAPS: 100.0000 mg | ORAL_CAPSULE | Freq: Two times a day (BID) | ORAL | 0 refills | Status: DC

## 2022-02-08 ENCOUNTER — Ambulatory Visit (INDEPENDENT_AMBULATORY_CARE_PROVIDER_SITE_OTHER): Payer: PPO

## 2022-02-08 ENCOUNTER — Encounter: Payer: Self-pay | Admitting: Podiatry

## 2022-02-08 ENCOUNTER — Ambulatory Visit: Payer: PPO | Admitting: Podiatry

## 2022-02-08 DIAGNOSIS — M779 Enthesopathy, unspecified: Secondary | ICD-10-CM | POA: Diagnosis not present

## 2022-02-08 DIAGNOSIS — R600 Localized edema: Secondary | ICD-10-CM | POA: Diagnosis not present

## 2022-02-08 DIAGNOSIS — M7671 Peroneal tendinitis, right leg: Secondary | ICD-10-CM | POA: Diagnosis not present

## 2022-02-08 DIAGNOSIS — M25571 Pain in right ankle and joints of right foot: Secondary | ICD-10-CM | POA: Diagnosis not present

## 2022-02-08 MED ORDER — TRIAMCINOLONE ACETONIDE 10 MG/ML IJ SUSP
10.0000 mg | Freq: Once | INTRAMUSCULAR | Status: AC
  Administered 2022-02-08: 10 mg

## 2022-02-08 NOTE — Progress Notes (Signed)
Subjective:   Patient ID: Cathy Gallegos, female   DOB: 71 y.o.   MRN: [id number]   HPI Patient presents stating she has had right ankle pain and edema 1-1/2 years and its after an injury.  States it gets sore and she also gets some numbness in her toes secondary to swelling is a chronic swelling of her midfoot and ankle ever since the injury a year and a half ago.  Does not smoke likes to be active   Review of Systems  All other systems reviewed and are negative.      Objective:  Physical Exam Vitals and nursing note reviewed.  Constitutional:      Appearance: She is well-developed.  Pulmonary:     Effort: Pulmonary effort is normal.  Musculoskeletal:        General: Normal range of motion.  Skin:    General: Skin is warm.  Neurological:     Mental Status: She is alert.    Neuro vascular status found to be intact muscle strength found to be adequate range of motion adequate negative Denna Haggard' sign was noted with edema in the right foot into the midfoot into the ankle with inflammation pain around the peroneal complex.  Patient does have good digital perfusion well oriented x3 and it is an unusual presentation     Assessment:  Unusual presentation of inflammation of the right foot and into the ankle with pain that is mostly around the peroneal complex     Plan:  H&P reviewed condition and we are going to start compression applied ankle compression stocking and because of the discomfort I did inject the sheath of the peroneal tendon lateral 3 mg dexamethasone Kenalog 5 mg Xylocaine which was tolerated well and patient will be seen back for [place] to recheck as symptoms indicate it cannot rule out any form of chronic pain syndrome due to the year and a half history of this.  May also want to consider fluid pill from her family physician  X-rays are negative for signs of fracture or spotty osteoporosis or other pathology associated with condition

## 2022-02-13 ENCOUNTER — Other Ambulatory Visit: Payer: Self-pay | Admitting: Podiatry

## 2022-02-13 DIAGNOSIS — M779 Enthesopathy, unspecified: Secondary | ICD-10-CM

## 2022-02-28 NOTE — Patient Instructions (Incomplete)
Good to see you, I will be in touch with your labs asap  I would suggest getting a covid booster if not done in the last 6 months or so, and also the shingles series (Shingrix) at your pharmacy  We will get you set up to see urology again to see if they have any ideas for you
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
## 2022-03-04 ENCOUNTER — Ambulatory Visit (INDEPENDENT_AMBULATORY_CARE_PROVIDER_SITE_OTHER): Payer: PPO | Admitting: Family Medicine

## 2022-03-04 ENCOUNTER — Encounter: Payer: Self-pay | Admitting: Family Medicine

## 2022-03-04 VITALS — BP 130/90 | HR 73 | Resp 20 | Ht 65.0 in | Wt 174.0 lb

## 2022-03-04 DIAGNOSIS — Z13220 Encounter for screening for lipoid disorders: Secondary | ICD-10-CM

## 2022-03-04 DIAGNOSIS — R7303 Prediabetes: Secondary | ICD-10-CM | POA: Diagnosis not present

## 2022-03-04 DIAGNOSIS — R5383 Other fatigue: Secondary | ICD-10-CM | POA: Diagnosis not present

## 2022-03-04 DIAGNOSIS — R3989 Other symptoms and signs involving the genitourinary system: Secondary | ICD-10-CM

## 2022-03-04 DIAGNOSIS — D649 Anemia, unspecified: Secondary | ICD-10-CM

## 2022-03-04 DIAGNOSIS — M810 Age-related osteoporosis without current pathological fracture: Secondary | ICD-10-CM | POA: Diagnosis not present

## 2022-03-05 ENCOUNTER — Telehealth: Payer: Self-pay

## 2022-03-05 DIAGNOSIS — N3000 Acute cystitis without hematuria: Secondary | ICD-10-CM

## 2022-03-05 LAB — LIPID PANEL
Cholesterol: 188 mg/dL (ref 0–200)
HDL: 67.2 mg/dL (ref >39.00)
LDL Cholesterol: 105 mg/dL — ABNORMAL HIGH (ref 0–99)
NonHDL: 121.02
Total CHOL/HDL Ratio: 3
Triglycerides: 78 mg/dL (ref 0.0–149.0)
VLDL: 15.6 mg/dL (ref 0.0–40.0)

## 2022-03-05 LAB — COMPREHENSIVE METABOLIC PANEL
ALT: 10 U/L (ref 0–35)
AST: 13 U/L (ref 0–37)
Albumin: 4 g/dL (ref 3.5–5.2)
Alkaline Phosphatase: 105 U/L (ref 39–117)
BUN: 21 mg/dL (ref 6–23)
CO2: 29 mEq/L (ref 19–32)
Calcium: 9.6 mg/dL (ref 8.4–10.5)
Chloride: 104 mEq/L (ref 96–112)
Creatinine, Ser: 0.95 mg/dL (ref 0.40–1.20)
GFR: 60.25 mL/min (ref >60.00)
Glucose, Bld: 84 mg/dL (ref 70–99)
Potassium: 4.3 mEq/L (ref 3.5–5.1)
Sodium: 140 mEq/L (ref 135–145)
Total Bilirubin: 0.4 mg/dL (ref 0.2–1.2)
Total Protein: 6.4 g/dL (ref 6.0–8.3)

## 2022-03-05 LAB — TSH: TSH: 4.81 u[IU]/mL (ref 0.35–5.50)

## 2022-03-05 LAB — CBC
HCT: 35.8 % — ABNORMAL LOW (ref 36.0–46.0)
Hemoglobin: 11.3 g/dL — ABNORMAL LOW (ref 12.0–15.0)
MCHC: 31.5 g/dL (ref 30.0–36.0)
MCV: 74.3 fl — ABNORMAL LOW (ref 78.0–100.0)
Platelets: 265 10*3/uL (ref 150.0–400.0)
RBC: 4.81 Mil/uL (ref 3.87–5.11)
RDW: 16.9 % — ABNORMAL HIGH (ref 11.5–15.5)
WBC: 8.2 10*3/uL (ref 4.0–10.5)

## 2022-03-05 LAB — HEMOGLOBIN A1C: Hgb A1c MFr Bld: 6 % (ref 4.6–6.5)

## 2022-03-05 NOTE — Addendum Note (Signed)
Addended by: Lamar Blinks C on: 03/05/2022 02:58 PM   Modules accepted: Orders

## 2022-03-05 NOTE — Telephone Encounter (Signed)
Pt does not have her mychart set up Called her back- no answer, VM full so cannot LM

## 2022-03-05 NOTE — Telephone Encounter (Signed)
Pt denied: slurred speech, drooping on one side of face, or weakness on one side of body, she denied.

## 2022-03-05 NOTE — Telephone Encounter (Signed)
Pt called in requesting to speak with Dr. Lorelei Pont- states that she was seen yesterday but yesterday evening she states "my brain wasn't functioning like it should", having toe cramps, and admits to having something wrong with her speech last night. I questioned whether she had slurred speech, drooping on one side of face, or weakness on one side of body, she denied. She is requesting call back from PCP at [phone] at her convenience.

## 2022-03-06 MED ORDER — CEPHALEXIN 500 MG PO CAPS: 500.0000 mg | ORAL_CAPSULE | Freq: Two times a day (BID) | ORAL | 0 refills | Status: DC

## 2022-03-06 NOTE — Telephone Encounter (Signed)
Called pt back and was able to reach her Let her know she does have a UTI - will treat with keflex  Patient describes about a 3-minute episode the night before last when her "brain did not work" and it sounds like her feet were cramping.  This resolved and has not come back.  She currently feels normal except fatigued I asked her to come in tomorrow at 10 AM to be seen and she agrees, if the symptoms return in the meantime she will go to the emergency room

## 2022-03-07 ENCOUNTER — Ambulatory Visit (INDEPENDENT_AMBULATORY_CARE_PROVIDER_SITE_OTHER): Payer: PPO | Admitting: Family Medicine

## 2022-03-07 ENCOUNTER — Ambulatory Visit (HOSPITAL_BASED_OUTPATIENT_CLINIC_OR_DEPARTMENT_OTHER)
Admission: RE | Admit: 2022-03-07 | Discharge: 2022-03-07 | Disposition: A | Discharge status: Home / Self Care | Payer: PPO | Source: Ambulatory Visit | Attending: Family Medicine | Admitting: Family Medicine

## 2022-03-07 VITALS — BP 122/80 | HR 77 | Temp 97.8°F | Resp 18 | Ht 65.0 in | Wt 177.8 lb

## 2022-03-07 DIAGNOSIS — N3000 Acute cystitis without hematuria: Secondary | ICD-10-CM | POA: Diagnosis not present

## 2022-03-07 DIAGNOSIS — R059 Cough, unspecified: Secondary | ICD-10-CM | POA: Diagnosis not present

## 2022-03-07 DIAGNOSIS — R062 Wheezing: Secondary | ICD-10-CM

## 2022-03-07 DIAGNOSIS — R413 Other amnesia: Secondary | ICD-10-CM

## 2022-03-07 DIAGNOSIS — R5381 Other malaise: Secondary | ICD-10-CM | POA: Diagnosis not present

## 2022-03-07 LAB — URINE CULTURE
MICRO NUMBER:: 13512842
SPECIMEN QUALITY:: ADEQUATE

## 2022-03-07 MED ORDER — AMOXICILLIN-POT CLAVULANATE 500-125 MG PO TABS: 1.0000 | ORAL_TABLET | Freq: Two times a day (BID) | ORAL | 0 refills | Status: DC

## 2022-03-07 NOTE — Patient Instructions (Signed)
It was good to see you today!  Please stop the cephalexin and change to augmentin for your UTI- take twice a day for 5 days Please go to imaging on the ground floor for an x-ray of your chest and to set up ultrasound of your carotid arteries I am also going to order an MRI of your brain/ head to make sure no sign of a stroke or other issue Please let me know if any other unusual symptoms should occur in the meantime!
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
## 2022-03-11 ENCOUNTER — Ambulatory Visit (HOSPITAL_BASED_OUTPATIENT_CLINIC_OR_DEPARTMENT_OTHER): Admission: RE | Admit: 2022-03-11 | Payer: PPO | Source: Ambulatory Visit

## 2022-03-17 ENCOUNTER — Ambulatory Visit (HOSPITAL_COMMUNITY)
Admission: RE | Admit: 2022-03-17 | Discharge: 2022-03-17 | Disposition: A | Discharge status: Home / Self Care | Payer: PPO | Source: Ambulatory Visit | Attending: Family Medicine | Admitting: Family Medicine

## 2022-03-17 DIAGNOSIS — R413 Other amnesia: Secondary | ICD-10-CM | POA: Diagnosis not present

## 2022-03-17 DIAGNOSIS — R42 Dizziness and giddiness: Secondary | ICD-10-CM | POA: Diagnosis not present

## 2022-03-17 MED ORDER — GADOBUTROL 1 MMOL/ML IV SOLN
8.0000 mL | Freq: Once | INTRAVENOUS | Status: AC | PRN
  Administered 2022-03-17: 8 mL via INTRAVENOUS

## 2022-03-18 ENCOUNTER — Ambulatory Visit (HOSPITAL_BASED_OUTPATIENT_CLINIC_OR_DEPARTMENT_OTHER)
Admission: RE | Admit: 2022-03-18 | Discharge: 2022-03-18 | Disposition: A | Discharge status: Home / Self Care | Payer: PPO | Source: Ambulatory Visit | Attending: Family Medicine | Admitting: Family Medicine

## 2022-03-18 DIAGNOSIS — R413 Other amnesia: Secondary | ICD-10-CM | POA: Diagnosis not present

## 2022-03-18 DIAGNOSIS — I6523 Occlusion and stenosis of bilateral carotid arteries: Secondary | ICD-10-CM | POA: Diagnosis not present

## 2022-03-19 ENCOUNTER — Telehealth: Payer: Self-pay | Admitting: Family Medicine

## 2022-03-19 DIAGNOSIS — R413 Other amnesia: Secondary | ICD-10-CM

## 2022-03-19 NOTE — Telephone Encounter (Signed)
Received brain MRI and carotid US results- called pt  US Carotid Duplex Bilateral  Result Date: 03/18/2022 CLINICAL DATA:  71 year old female possible CVA EXAM: BILATERAL CAROTID DUPLEX ULTRASOUND TECHNIQUE: Wallace Cullens scale imaging, color Doppler and duplex ultrasound were performed of bilateral carotid and vertebral arteries in the neck. COMPARISON:  None Available. FINDINGS: Criteria: Quantification of carotid stenosis is based on velocity parameters that correlate the residual internal carotid diameter with NASCET-based stenosis levels, using the diameter of the distal internal carotid lumen as the denominator for stenosis measurement. The following velocity measurements were obtained: RIGHT ICA:  Systolic 88 cm/sec, Diastolic 35 cm/sec CCA:  75 cm/sec SYSTOLIC ICA/CCA RATIO:  1.2 ECA:  72 cm/sec LEFT ICA:  Systolic 100 cm/sec, Diastolic 29 cm/sec CCA:  90 cm/sec SYSTOLIC ICA/CCA RATIO:  1.1 ECA:  72 cm/sec Right Brachial SBP: 135 Left Brachial SBP: 148 RIGHT CAROTID ARTERY: No significant calcifications of the right common carotid artery. Intermediate waveform maintained. Heterogeneous and partially calcified plaque at the right carotid bifurcation. No significant lumen shadowing. Low resistance waveform of the right ICA. No significant tortuosity. RIGHT VERTEBRAL ARTERY: Antegrade flow with low resistance waveform. LEFT CAROTID ARTERY: No significant calcifications of the left common carotid artery. Intermediate waveform maintained. Heterogeneous and partially calcified plaque at the left carotid bifurcation without significant lumen shadowing. Low resistance waveform of the left ICA. No significant tortuosity. LEFT VERTEBRAL ARTERY:  Antegrade flow with low resistance waveform. IMPRESSION: Color duplex indicates minimal heterogeneous and calcified plaque, with no hemodynamically significant stenosis by duplex criteria in the extracranial cerebrovascular circulation. Signed, Yvone Neu. Miachel Roux, RPVI  [facility] Electronically Signed   By: Gilmer Mor D.O.   On: 03/18/2022 16:16     MRI HEAD WITHOUT AND WITH CONTRAST   TECHNIQUE: Multiplanar, multiecho pulse sequences of the brain and surrounding structures were obtained without and with intravenous contrast. CONTRAST:  8mL GADAVIST GADOBUTROL 1 MMOL/ML IV SOLN COMPARISON:  Head CT 10/18/2017 and MRI 11/19/2015   FINDINGS: Brain: There is no evidence of an acute infarct, intracranial hemorrhage, mass, midline shift, or extra-axial fluid collection. T2 hyperintensities in the cerebral white matter bilaterally are stable to minimally progressive compared to the prior MRI and are nonspecific but compatible with mild chronic small vessel ischemic disease. No abnormal enhancement is identified. Moderate cerebral atrophy is similar to the prior MRI and most prominent in the parietal regions.   Vascular: Major intracranial vascular flow voids are preserved. Skull and upper cervical spine: Unremarkable bone marrow signal. Sinuses/Orbits: Right cataract extraction. Clear paranasal sinuses. Small left mastoid effusion. Other: None.   IMPRESSION: 1. No acute intracranial abnormality. 2. Mild chronic small vessel ischemic disease and moderate cerebral atrophy.    Called pt- went over her results We will get her in to see neurology to get their thoughts.  She has seen Dr Terrace Arabia in the past for headaches

## 2022-05-13 ENCOUNTER — Ambulatory Visit: Payer: PPO | Admitting: Neurology

## 2022-05-13 ENCOUNTER — Encounter: Payer: Self-pay | Admitting: Neurology

## 2022-05-13 VITALS — BP 153/83 | HR 94 | Ht 65.0 in | Wt 175.5 lb

## 2022-05-13 DIAGNOSIS — G3184 Mild cognitive impairment, so stated: Secondary | ICD-10-CM | POA: Insufficient documentation
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
## 2022-05-14 ENCOUNTER — Telehealth: Payer: Self-pay

## 2022-05-14 LAB — SEDIMENTATION RATE: Sed Rate: 13 mm/hr (ref 0–40)

## 2022-05-14 LAB — C-REACTIVE PROTEIN: CRP: 8 mg/L (ref 0–10)

## 2022-05-14 LAB — VITAMIN B12: Vitamin B-12: 240 pg/mL (ref 232–1245)

## 2022-05-14 LAB — RPR: RPR Ser Ql: NONREACTIVE

## 2022-05-14 NOTE — Telephone Encounter (Signed)
-----   Message from Levert Feinstein, MD sent at 05/14/2022  8:40 AM EDT ----- Please call patient, laboratory evaluation showed low normal range B12 level, she would benefit over-the-counter B12 supplement 1000 mcg daily.  Rest of the laboratory evaluation were within normal limit.

## 2022-05-14 NOTE — Telephone Encounter (Signed)
I called the pt and attempted to the leave a vm. Vm was full and not accepting new messages at the the time of the call. Will call back at a later time.

## 2022-05-16 ENCOUNTER — Telehealth: Payer: Self-pay

## 2022-05-16 NOTE — Telephone Encounter (Signed)
-----   Message from Yijun Yan, MD sent at 05/14/2022  8:40 AM EDT ----- Please call patient, laboratory evaluation showed low normal range B12 level, she would benefit over-the-counter B12 supplement 1000 mcg daily.  Rest of the laboratory evaluation were within normal limit. 

## 2022-05-16 NOTE — Telephone Encounter (Signed)
Pt returned phone call, would like a call back.  

## 2022-05-16 NOTE — Telephone Encounter (Signed)
Attempted to call pt. Mail box full.  °

## 2022-05-16 NOTE — Telephone Encounter (Signed)
I attempted to call the pt back. Vm full not accepting new messages.   When pt calls back telephone room staff can relay message.

## 2022-05-17 ENCOUNTER — Telehealth: Payer: Self-pay

## 2022-05-17 NOTE — Telephone Encounter (Signed)
Unable to reach due to vm being full-Cathy Gallegos

## 2022-05-23 NOTE — Telephone Encounter (Signed)
FYI pt called and results were relayed, no questions.

## 2022-06-22 ENCOUNTER — Other Ambulatory Visit: Payer: Self-pay | Admitting: Family Medicine

## 2022-06-24 ENCOUNTER — Ambulatory Visit: Payer: PPO | Admitting: Podiatry

## 2022-07-04 ENCOUNTER — Encounter: Payer: Self-pay | Admitting: Podiatry

## 2022-07-04 ENCOUNTER — Ambulatory Visit: Payer: PPO | Admitting: Podiatry

## 2022-07-04 DIAGNOSIS — T148XXA Other injury of unspecified body region, initial encounter: Secondary | ICD-10-CM

## 2022-07-04 DIAGNOSIS — M25571 Pain in right ankle and joints of right foot: Secondary | ICD-10-CM | POA: Diagnosis not present

## 2022-07-05 NOTE — Progress Notes (Signed)
Subjective:   Patient ID: Cathy Gallegos, female   DOB: 71 y.o.   MRN: [id number]   HPI Patient presents stating she is getting a lot of pain still in the outside of her right ankle and into the ankle joint itself.  States that there is still swelling is not as bad as it was but she feels unstable and states that it has worsened over the last couple months    ROS      Objective:  Physical Exam  Neuro vascular status intact with quite a bit of swelling in the peroneal group as it comes underneath the lateral malleolus right with also reduced range of motion of the subtalar joint no crepitus     Assessment:  Possibility for tear of the peroneal tendon right versus ankle arthritis subtalar joint arthritis or combination      Plan:  H&P condition reviewed discussed at great length.  At this point I am sending for MRI to rule out a tear of the tendon or arthritis of the subtalar or ankle joint right and due to the intensity of discomfort and inability to walk comfortably on the foot I did go ahead and I applied air fracture walker which was fitted properly to her lower leg with all instructions given for wear that I want her to wear is much as she can along with ice therapy.  Reappoint when we get results of MRI

## 2022-07-25 ENCOUNTER — Other Ambulatory Visit (INDEPENDENT_AMBULATORY_CARE_PROVIDER_SITE_OTHER): Payer: PPO

## 2022-07-25 ENCOUNTER — Ambulatory Visit (INDEPENDENT_AMBULATORY_CARE_PROVIDER_SITE_OTHER): Payer: PPO

## 2022-07-25 DIAGNOSIS — R3989 Other symptoms and signs involving the genitourinary system: Secondary | ICD-10-CM

## 2022-07-25 DIAGNOSIS — Z23 Encounter for immunization: Secondary | ICD-10-CM

## 2022-07-25 DIAGNOSIS — M810 Age-related osteoporosis without current pathological fracture: Secondary | ICD-10-CM

## 2022-07-25 DIAGNOSIS — R5383 Other fatigue: Secondary | ICD-10-CM | POA: Diagnosis not present

## 2022-07-25 DIAGNOSIS — R7303 Prediabetes: Secondary | ICD-10-CM | POA: Diagnosis not present

## 2022-07-25 DIAGNOSIS — D649 Anemia, unspecified: Secondary | ICD-10-CM | POA: Diagnosis not present

## 2022-07-25 DIAGNOSIS — Z13220 Encounter for screening for lipoid disorders: Secondary | ICD-10-CM | POA: Diagnosis not present

## 2022-07-25 LAB — CBC
HCT: 34.6 % — ABNORMAL LOW (ref 36.0–46.0)
Hemoglobin: 10.9 g/dL — ABNORMAL LOW (ref 12.0–15.0)
MCHC: 31.5 g/dL (ref 30.0–36.0)
MCV: 74.1 fl — ABNORMAL LOW (ref 78.0–100.0)
Platelets: 262 10*3/uL (ref 150.0–400.0)
RBC: 4.67 Mil/uL (ref 3.87–5.11)
RDW: 17.9 % — ABNORMAL HIGH (ref 11.5–15.5)
WBC: 7.7 10*3/uL (ref 4.0–10.5)

## 2022-07-25 LAB — FERRITIN: Ferritin: 5.6 ng/mL — ABNORMAL LOW (ref 10.0–291.0)

## 2022-07-30 ENCOUNTER — Telehealth: Payer: Self-pay | Admitting: Family Medicine

## 2022-07-30 DIAGNOSIS — D509 Iron deficiency anemia, unspecified: Secondary | ICD-10-CM

## 2022-07-30 NOTE — Telephone Encounter (Signed)
Called pt to go over her labs- her VM is full and she does not have mychart.  Will send her a result letter and FOBT for home testing, referral to hematology to treat her anemia   Results for orders placed or performed in visit on 07/25/22  Ferritin  Result Value Ref Range   Ferritin 5.6 (L) 10.0 - 291.0 ng/mL  CBC  Result Value Ref Range   WBC 7.7 4.0 - 10.5 K/uL   RBC 4.67 3.87 - 5.11 Mil/uL   Platelets 262.0 150.0 - 400.0 K/uL   Hemoglobin 10.9 (L) 12.0 - 15.0 g/dL   HCT 34.6 (L) 36.0 - 46.0 %   MCV 74.1 (L) 78.0 - 100.0 fl   MCHC 31.5 30.0 - 36.0 g/dL   RDW 17.9 (H) 11.5 - 15.5 %

## 2022-07-31 NOTE — Telephone Encounter (Signed)
Caller Name Correna Meacham Caller Phone Number [phone] Call Type Message Only Information Provided Reason for Call Returning a Call from the Office Initial Comment Caller states she's returning a call. Additional Comment Office hours provided. Disp. Time Disposition Final User 07/30/2022 5:53:50 PM General Information Provided Yes Percell Locus Call Closed By: Percell Locus Transaction Date/Time: 07/30/2022 5:51:51 PM (ET)

## 2022-07-31 NOTE — Telephone Encounter (Signed)
Called and went over her labs- explained we are doing a hematology referral and sending stool test due to anemia

## 2022-08-01 ENCOUNTER — Encounter: Payer: Self-pay | Admitting: Family Medicine

## 2022-08-14 ENCOUNTER — Other Ambulatory Visit: Payer: Self-pay | Admitting: Family

## 2022-08-14 ENCOUNTER — Inpatient Hospital Stay: Payer: PPO | Attending: Hematology & Oncology

## 2022-08-14 ENCOUNTER — Encounter: Payer: Self-pay | Admitting: Family

## 2022-08-14 ENCOUNTER — Inpatient Hospital Stay (HOSPITAL_BASED_OUTPATIENT_CLINIC_OR_DEPARTMENT_OTHER): Payer: PPO | Admitting: Family

## 2022-08-14 VITALS — BP 147/87 | HR 71 | Temp 98.0°F | Resp 17 | Ht 65.0 in

## 2022-08-14 DIAGNOSIS — R42 Dizziness and giddiness: Secondary | ICD-10-CM

## 2022-08-14 DIAGNOSIS — Z8249 Family history of ischemic heart disease and other diseases of the circulatory system: Secondary | ICD-10-CM

## 2022-08-14 DIAGNOSIS — Z803 Family history of malignant neoplasm of breast: Secondary | ICD-10-CM | POA: Insufficient documentation

## 2022-08-14 DIAGNOSIS — K5730 Diverticulosis of large intestine without perforation or abscess without bleeding: Secondary | ICD-10-CM | POA: Insufficient documentation

## 2022-08-14 DIAGNOSIS — D649 Anemia, unspecified: Secondary | ICD-10-CM

## 2022-08-14 DIAGNOSIS — D509 Iron deficiency anemia, unspecified: Secondary | ICD-10-CM | POA: Diagnosis not present

## 2022-08-14 DIAGNOSIS — Z888 Allergy status to other drugs, medicaments and biological substances status: Secondary | ICD-10-CM | POA: Diagnosis not present

## 2022-08-14 DIAGNOSIS — Z808 Family history of malignant neoplasm of other organs or systems: Secondary | ICD-10-CM | POA: Insufficient documentation

## 2022-08-14 DIAGNOSIS — Z79899 Other long term (current) drug therapy: Secondary | ICD-10-CM | POA: Insufficient documentation

## 2022-08-14 DIAGNOSIS — R531 Weakness: Secondary | ICD-10-CM | POA: Insufficient documentation

## 2022-08-14 LAB — CBC WITH DIFFERENTIAL (CANCER CENTER ONLY)
Abs Immature Granulocytes: 0.09 10*3/uL — ABNORMAL HIGH (ref 0.00–0.07)
Basophils Absolute: 0 10*3/uL (ref 0.0–0.1)
Basophils Relative: 1 %
Eosinophils Absolute: 0.2 10*3/uL (ref 0.0–0.5)
Eosinophils Relative: 3 %
HCT: 33.7 % — ABNORMAL LOW (ref 36.0–46.0)
Hemoglobin: 10.3 g/dL — ABNORMAL LOW (ref 12.0–15.0)
Immature Granulocytes: 1 %
Lymphocytes Relative: 32 %
Lymphs Abs: 2.6 10*3/uL (ref 0.7–4.0)
MCH: 23.2 pg — ABNORMAL LOW (ref 26.0–34.0)
MCHC: 30.6 g/dL (ref 30.0–36.0)
MCV: 75.9 fL — ABNORMAL LOW (ref 80.0–100.0)
Monocytes Absolute: 0.4 10*3/uL (ref 0.1–1.0)
Monocytes Relative: 5 %
Neutro Abs: 4.8 10*3/uL (ref 1.7–7.7)
Neutrophils Relative %: 58 %
Platelet Count: 291 10*3/uL (ref 150–400)
RBC: 4.44 MIL/uL (ref 3.87–5.11)
RDW: 16.4 % — ABNORMAL HIGH (ref 11.5–15.5)
WBC Count: 8.1 10*3/uL (ref 4.0–10.5)
nRBC: 0 % (ref 0.0–0.2)

## 2022-08-14 LAB — CMP (CANCER CENTER ONLY)
ALT: 8 U/L (ref 0–44)
AST: 12 U/L — ABNORMAL LOW (ref 15–41)
Albumin: 4.2 g/dL (ref 3.5–5.0)
Alkaline Phosphatase: 96 U/L (ref 38–126)
Anion gap: 8 (ref 5–15)
BUN: 22 mg/dL (ref 8–23)
CO2: 26 mmol/L (ref 22–32)
Calcium: 9.4 mg/dL (ref 8.9–10.3)
Chloride: 106 mmol/L (ref 98–111)
Creatinine: 0.85 mg/dL (ref 0.44–1.00)
GFR, Estimated: >60 mL/min (ref >60)
Glucose, Bld: 101 mg/dL — ABNORMAL HIGH (ref 70–99)
Potassium: 3.9 mmol/L (ref 3.5–5.1)
Sodium: 140 mmol/L (ref 135–145)
Total Bilirubin: 0.3 mg/dL (ref 0.3–1.2)
Total Protein: 6.5 g/dL (ref 6.5–8.1)

## 2022-08-14 LAB — RETICULOCYTES
Immature Retic Fract: 13.8 % (ref 2.3–15.9)
RBC.: 4.46 MIL/uL (ref 3.87–5.11)
Retic Count, Absolute: 62 10*3/uL (ref 19.0–186.0)
Retic Ct Pct: 1.4 % (ref 0.4–3.1)

## 2022-08-14 LAB — IRON AND IRON BINDING CAPACITY (CC-WL,HP ONLY)
Iron: 43 ug/dL (ref 28–170)
Saturation Ratios: 8 % — ABNORMAL LOW (ref 10.4–31.8)
TIBC: 526 ug/dL — ABNORMAL HIGH (ref 250–450)
UIBC: 483 ug/dL — ABNORMAL HIGH (ref 148–442)

## 2022-08-14 LAB — LACTATE DEHYDROGENASE: LDH: 162 U/L (ref 98–192)
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
## 2022-08-15 LAB — ERYTHROPOIETIN: Erythropoietin: 24.1 m[IU]/mL — ABNORMAL HIGH (ref 2.6–18.5)

## 2022-08-16 ENCOUNTER — Emergency Department (HOSPITAL_BASED_OUTPATIENT_CLINIC_OR_DEPARTMENT_OTHER): Payer: PPO

## 2022-08-16 ENCOUNTER — Encounter (HOSPITAL_BASED_OUTPATIENT_CLINIC_OR_DEPARTMENT_OTHER): Payer: Self-pay | Admitting: Emergency Medicine

## 2022-08-16 ENCOUNTER — Other Ambulatory Visit: Payer: Self-pay

## 2022-08-16 ENCOUNTER — Emergency Department (HOSPITAL_BASED_OUTPATIENT_CLINIC_OR_DEPARTMENT_OTHER)
Admission: EM | Admit: 2022-08-16 | Discharge: 2022-08-16 | Disposition: A | Discharge status: Home / Self Care | Payer: PPO | Attending: Emergency Medicine | Admitting: Emergency Medicine

## 2022-08-16 DIAGNOSIS — S0990XA Unspecified injury of head, initial encounter: Secondary | ICD-10-CM | POA: Diagnosis not present

## 2022-08-16 DIAGNOSIS — M25532 Pain in left wrist: Secondary | ICD-10-CM | POA: Diagnosis not present

## 2022-08-16 DIAGNOSIS — W19XXXA Unspecified fall, initial encounter: Secondary | ICD-10-CM

## 2022-08-16 DIAGNOSIS — Z91040 Latex allergy status: Secondary | ICD-10-CM | POA: Diagnosis not present

## 2022-08-16 DIAGNOSIS — S42202A Unspecified fracture of upper end of left humerus, initial encounter for closed fracture: Secondary | ICD-10-CM | POA: Insufficient documentation

## 2022-08-16 DIAGNOSIS — I6782 Cerebral ischemia: Secondary | ICD-10-CM | POA: Diagnosis not present

## 2022-08-16 DIAGNOSIS — M25522 Pain in left elbow: Secondary | ICD-10-CM | POA: Diagnosis not present

## 2022-08-16 DIAGNOSIS — M542 Cervicalgia: Secondary | ICD-10-CM | POA: Diagnosis not present

## 2022-08-16 DIAGNOSIS — Y92002 Bathroom of unspecified non-institutional (private) residence single-family (private) house as the place of occurrence of the external cause: Secondary | ICD-10-CM | POA: Diagnosis not present

## 2022-08-16 DIAGNOSIS — W182XXA Fall in (into) shower or empty bathtub, initial encounter: Secondary | ICD-10-CM | POA: Diagnosis not present

## 2022-08-16 DIAGNOSIS — M25512 Pain in left shoulder: Secondary | ICD-10-CM | POA: Diagnosis not present

## 2022-08-16 DIAGNOSIS — M47812 Spondylosis without myelopathy or radiculopathy, cervical region: Secondary | ICD-10-CM | POA: Diagnosis not present

## 2022-08-16 DIAGNOSIS — S42212A Unspecified displaced fracture of surgical neck of left humerus, initial encounter for closed fracture: Secondary | ICD-10-CM | POA: Diagnosis not present

## 2022-08-16 MED ORDER — ACETAMINOPHEN 325 MG PO TABS
650.0000 mg | ORAL_TABLET | Freq: Once | ORAL | Status: AC
  Administered 2022-08-16: 650 mg via ORAL
  Filled 2022-08-16: qty 2

## 2022-08-16 MED ORDER — IBUPROFEN 400 MG PO TABS
600.0000 mg | ORAL_TABLET | Freq: Once | ORAL | Status: AC
  Administered 2022-08-16: 600 mg via ORAL
  Filled 2022-08-16: qty 1

## 2022-08-16 NOTE — ED Notes (Signed)
Repositioned patient Extra pillow for support

## 2022-08-16 NOTE — ED Notes (Addendum)
Patient transported to XR-CT 
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
## 2022-08-16 NOTE — ED Provider Notes (Signed)
[facility] EMERGENCY DEPARTMENT Provider Note   CSN: [id number] Arrival date & time: 08/16/22  1347     History  Chief Complaint  Patient presents with   Cathy Gallegos is a 71 y.o. female.  Patient is a 71 year old female past medical history of osteoporosis, prediabetes and allergies presenting to the emergency department after a fall.  The patient states that she slipped and fell in the shower yesterday on some soap.  She states that she landed on her left shoulder and believes that she hit the left front side of her head.  She denies any loss of consciousness.  She states that she has been nauseous but denies any vomiting.  She denies any numbness or weakness.  She states that she is having pain throughout her whole left arm.  Denies any blood thinner use.  The history is provided by the patient.  Fall       Home Medications Prior to Admission medications   Medication Sig Start Date End Date Taking? Authorizing Provider  EPINEPHrine (EPIPEN 2-PAK) 0.3 mg/0.3 mL IJ SOAJ injection Inject 0.3 mg into the muscle as needed for anaphylaxis. 11/07/21   Marcelyn Bruins, MD  PARoxetine (PAXIL) 40 MG tablet Take 1 tablet (40 mg total) by mouth daily. 06/24/22   Copland, Gwenlyn Found, MD      Allergies    Dust mite mixed allergen ext [mite (d. farinae)], Gabapentin, Latex, and Molds & smuts    Review of Systems   Review of Systems  Physical Exam Updated Vital Signs BP (!) 119/91   Pulse 75   Temp 98.1 F (36.7 C)   Resp 18   Ht 5\' 5"  (1.651 m)   Wt 79.4 kg   SpO2 98%   BMI 29.12 kg/m  Physical Exam Vitals and nursing note reviewed.  Constitutional:      General: She is not in acute distress.    Appearance: Normal appearance.  HENT:     Head: Normocephalic and atraumatic.     Nose: Nose normal.     Mouth/Throat:     Mouth: Mucous membranes are moist.     Pharynx: Oropharynx is clear.  Eyes:     Extraocular Movements: Extraocular movements  intact.     Conjunctiva/sclera: Conjunctivae normal.     Pupils: Pupils are equal, round, and reactive to light.  Neck:     Comments: No midline neck tenderness Mild left-sided paraspinal muscle tenderness to palpation Cardiovascular:     Rate and Rhythm: Normal rate and regular rhythm.     Pulses: Normal pulses.     Heart sounds: Normal heart sounds.  Pulmonary:     Effort: Pulmonary effort is normal.     Breath sounds: Normal breath sounds.  Abdominal:     General: Abdomen is flat.     Palpations: Abdomen is soft.     Tenderness: There is no abdominal tenderness.  Musculoskeletal:     Cervical back: Normal range of motion and neck supple.     Comments: Tenderness to palpation of left shoulder with overlying contusion of left humerus No significant bony tenderness to palpation of left elbow though she does have increased pain with ROM of left elbow Mild tenderness to palpation of left wrist No tenderness to palpation of right upper extremity or bilateral lower extremities No midline back tenderness Elvis stable, nontender  Skin:    General: Skin is warm and dry.     Findings: Bruising (LUE)  present.  Neurological:     General: No focal deficit present.     Mental Status: She is alert and oriented to person, place, and time.     Sensory: No sensory deficit.     Motor: No weakness.  Psychiatric:        Mood and Affect: Mood normal.        Behavior: Behavior normal.     ED Results / Procedures / Treatments   Labs (all labs ordered are listed, but only abnormal results are displayed) Labs Reviewed - No data to display  EKG None  Radiology CT Cervical Spine Wo Contrast  Result Date: 08/16/2022 CLINICAL DATA:  Larey Seat. EXAM: CT CERVICAL SPINE WITHOUT CONTRAST TECHNIQUE: Multidetector CT imaging of the cervical spine was performed without intravenous contrast. Multiplanar CT image reconstructions were also generated. RADIATION DOSE REDUCTION: This exam was performed  according to the departmental dose-optimization program which includes automated exposure control, adjustment of the mA and/or kV according to patient size and/or use of iterative reconstruction technique. COMPARISON:  None Available. FINDINGS: Alignment: Normal Skull base and vertebrae: No acute fracture. No primary bone lesion or focal pathologic process. Soft tissues and spinal canal: No prevertebral fluid or swelling. No visible canal hematoma. Disc levels: The spinal canal is fairly generous. No canal stenosis. Multilevel uncinate spurring and facet disease contributing to mild multilevel bony foraminal narrowing. Upper chest: The lung apices are grossly clear. Other: No neck mass, adenopathy or hematoma. IMPRESSION: 1. Normal alignment and no acute bony findings. 2. Degenerative cervical spondylosis with multilevel disc disease and facet disease. Electronically Signed   By: Rudie Meyer M.D.   On: 08/16/2022 18:37   DG Elbow Complete Left  Result Date: 08/16/2022 CLINICAL DATA:  Wrist pain after fall; patient has fracture through the proximal humerus EXAM: LEFT ELBOW - COMPLETE 3+ VIEW COMPARISON:  None Available. FINDINGS: Cortical irregularity about the radial head is indeterminate for degenerative change versus nondisplaced fracture. Question small joint effusion. No dislocation. Soft tissues are unremarkable. IMPRESSION: Question nondisplaced radial head fracture. Consider CT for further evaluation. Possible small elbow joint effusion. Electronically Signed   By: Minerva Fester M.D.   On: 08/16/2022 16:17   DG Wrist Complete Left  Result Date: 08/16/2022 CLINICAL DATA:  Larey Seat.  Left wrist pain. EXAM: LEFT WRIST - COMPLETE 3+ VIEW COMPARISON:  None Available. FINDINGS: The joint spaces are maintained. No acute wrist or proximal hand fractures are identified. Mild degenerative changes at the wrist and moderate to advanced degenerative changes at the [facility] joint of the thumb. IMPRESSION: No acute  bony findings. Electronically Signed   By: Rudie Meyer M.D.   On: 08/16/2022 16:13   DG Shoulder Left  Result Date: 08/16/2022 CLINICAL DATA:  Fall while in the type of.  LEFT shoulder pain. EXAM: LEFT SHOULDER - 2+ VIEW COMPARISON:  05/20/2013 FINDINGS: There is an acute fracture of the LEFT humeral neck, associated with impaction. There is no significant angulation at the fracture site. Suspect fracture of the greater tuberosity. LEFT lung apex is clear. IMPRESSION: Acute fracture of the LEFT humeral neck. Suspect fracture of the greater tuberosity. Electronically Signed   By: Norva Pavlov M.D.   On: 08/16/2022 14:21    Procedures Procedures    Medications Ordered in ED Medications  ibuprofen (ADVIL) tablet 600 mg (600 mg Oral Given 08/16/22 1535)  acetaminophen (TYLENOL) tablet 650 mg (650 mg Oral Given 08/16/22 1535)    ED Course/ Medical Decision Making/ A&P Clinical Course  as of 08/16/22 1930  Fri Aug 16, 2022  1929 Patient's remainder of her imaging showed no acute traumatic injuries.  Questionable radial head fracture on left elbow x-ray however on reassessment the patient has no tenderness to her left elbow and has full ROM in flexion, extension, supination and pronation.  She was placed in a shoulder sling and will be given orthopedic follow-up. [VK]    Clinical Course User Index [VK] Rexford Maus, DO                           Medical Decision Making This patient presents to the ED with chief complaint(s) of fall with pertinent past medical history of osteoporosis, prediabetes which further complicates the presenting complaint. The complaint involves an extensive differential diagnosis and also carries with it a high risk of complications and morbidity.    The differential diagnosis includes due to patient's age and fall, considering ICH, mass effect, cervical spine fracture, she does have significant pain to the left upper extremity concerning for fracture  dislocation of the left upper extremity.  She has no other signs of injury on exam  Additional history obtained: Additional history obtained from N/A Records reviewed N/A  ED Course and Reassessment: Patient was initially evaluated by triage and had left shoulder x-ray performed that did show a proximal humerus fracture.  Due to patient's age with distracting injury, she will additionally have CT head and C-spine performed to evaluate for ICH or mass effect.  She is also having pain in her left elbow and left wrist and will have x-rays performed of the elbow and wrist.  She is neurovascularly intact.  She has no other signs of injury on exam.  Independent labs interpretation:  N/A  Independent visualization of imaging: - I independently visualized the following imaging with scope of interpretation limited to determining acute life threatening conditions related to emergency care: L UE X-rays, CTH/C-spine, which revealed No acute traumatic injury on CTH/C-spine, Proximal humerus fracture on L, questionable radial head fx  Consultation: - Consulted or discussed management/test interpretation w/ external professional: N/A  Consideration for admission or further workup: Patient's no emergent conditions requiring admission or further work-up at this time and is stable for discharge home with patient orthopedics follow-up Social Determinants of health: N/A    Amount and/or Complexity of Data Reviewed Radiology: ordered.  Risk OTC drugs.          Final Clinical Impression(s) / ED Diagnoses Final diagnoses:  Fall, initial encounter  Closed fracture of proximal end of left humerus, unspecified fracture morphology, initial encounter    Rx / DC Orders ED Discharge Orders     None         Rexford Maus, DO 08/16/22 1930

## 2022-08-16 NOTE — Discharge Instructions (Signed)
Were seen in the emergency department after your fall.  You did break your arm on your left side up near your shoulder.  This is treated with a sling and you should leave the sling on at all times except to shower until further instructed by the orthopedic doctor.  You should follow-up with them within the next week.  You can take Tylenol and Motrin as needed for pain.  Both can be taken up to every 6 hours.  You should ice your shoulder.  You should return to the emergency department if you have worsening numbness or weakness or any other new or concerning symptoms.

## 2022-08-19 ENCOUNTER — Telehealth: Payer: Self-pay

## 2022-08-19 NOTE — Telephone Encounter (Signed)
Nurse Assessment Nurse: Clarita Leber, RN, Deborah Date/Time (Eastern Time): 08/16/2022 12:35:21 PM Confirm and document reason for call. If symptomatic, describe symptoms. ---The caller states that she fell in the bathtub last night and hit her left shoulder and her headache. Has a small knot on her head. She states that her shoulder is very swollen. Unable to use arm. Does have sensation. Can't pick up anything. Lives alone. Does the patient have any new or worsening symptoms? ---Yes Will a triage be completed? ---Yes Related visit to physician within the last 2 weeks? ---No Does the PT have any chronic conditions? (i.e. diabetes, asthma, this includes High risk factors for pregnancy, etc.) ---No Is this a behavioral health or substance abuse call? ---No Guidelines Guideline Title Affirmed Question Affirmed Notes Nurse Date/Time (Eastern Time) Shoulder Injury Can't move injured shoulder at all [facility], RNGavin Pound 08/16/2022 12:37:48 PM Disp. Time Lamount Cohen Time) Disposition Final User 08/16/2022 12:42:45 PM Go to ED Now Yes Clarita Leber, RN, Gavin Pound Final Disposition 08/16/2022 12:42:45 PM Go to ED Now Yes Clarita Leber, RN, Gavin Pound PLEASE NOTE: All timestamps contained within this report are represented as [place] Standard Time. CONFIDENTIALTY NOTICE: This fax transmission is intended only for the addressee. It contains information that is legally privileged, confidential or otherwise protected from use or disclosure. If you are not the intended recipient, you are strictly prohibited from reviewing, disclosing, copying using or disseminating any of this information or taking any action in reliance on or regarding this information. If you have received this fax in error, please notify us immediately by telephone so that we can arrange for its return to [place]. Phone: [phone], Toll-Free: [phone], Fax: [phone] Page: 2 of 2 Call Id: [id number] Caller Disagree/Comply Comply Caller  Understands Yes PreDisposition Call Doctor Care Advice Given Per Guideline GO TO ED NOW: * Do not eat or drink anything for now. Referrals [facility] - ED

## 2022-08-19 NOTE — Telephone Encounter (Signed)
Pt seen in ED 

## 2022-08-20 ENCOUNTER — Telehealth: Payer: Self-pay | Admitting: Family Medicine

## 2022-08-20 MED ORDER — HYDROCODONE-ACETAMINOPHEN 5-325 MG PO TABS: 1.0000 | ORAL_TABLET | Freq: Four times a day (QID) | ORAL | 0 refills | Status: DC | PRN

## 2022-08-20 NOTE — Telephone Encounter (Signed)
Patient went to ED Friday for a fractured humerus. Patient said ED provider offered pain meds but she thought otc meds would help but they are not. She would like to know if Dr. Patsy Lager could call in something for pain. Please call patient to advise.

## 2022-08-20 NOTE — Telephone Encounter (Signed)
Called patient to, she is working on getting in with orthopedics.  She does not have anything for pain except for over-the-counter medications.  Called in hydrocodone, advised her on how to use this medication safely.  She will let me know if any concerns

## 2022-08-21 ENCOUNTER — Inpatient Hospital Stay: Payer: PPO

## 2022-08-21 VITALS — BP 132/53 | HR 84 | Resp 18

## 2022-08-21 DIAGNOSIS — D509 Iron deficiency anemia, unspecified: Secondary | ICD-10-CM

## 2022-08-21 MED ORDER — SODIUM CHLORIDE 0.9 % IV SOLN
1000.0000 mg | Freq: Once | INTRAVENOUS | Status: AC
  Administered 2022-08-21: 1000 mg via INTRAVENOUS
  Filled 2022-08-21: qty 10

## 2022-08-21 MED ORDER — SODIUM CHLORIDE 0.9 % IV SOLN: Freq: Once | INTRAVENOUS | Status: AC

## 2022-08-21 NOTE — Patient Instructions (Signed)

## 2022-08-29 DIAGNOSIS — M25522 Pain in left elbow: Secondary | ICD-10-CM | POA: Diagnosis not present

## 2022-08-29 DIAGNOSIS — S42212S Unspecified displaced fracture of surgical neck of left humerus, sequela: Secondary | ICD-10-CM | POA: Diagnosis not present

## 2022-08-29 DIAGNOSIS — S42212A Unspecified displaced fracture of surgical neck of left humerus, initial encounter for closed fracture: Secondary | ICD-10-CM | POA: Diagnosis not present

## 2022-08-29 DIAGNOSIS — S52125A Nondisplaced fracture of head of left radius, initial encounter for closed fracture: Secondary | ICD-10-CM | POA: Diagnosis not present

## 2022-09-11 DIAGNOSIS — M25512 Pain in left shoulder: Secondary | ICD-10-CM | POA: Diagnosis not present

## 2022-09-11 DIAGNOSIS — M25612 Stiffness of left shoulder, not elsewhere classified: Secondary | ICD-10-CM | POA: Diagnosis not present

## 2022-09-12 DIAGNOSIS — S42212D Unspecified displaced fracture of surgical neck of left humerus, subsequent encounter for fracture with routine healing: Secondary | ICD-10-CM | POA: Diagnosis not present

## 2022-09-13 ENCOUNTER — Ambulatory Visit (INDEPENDENT_AMBULATORY_CARE_PROVIDER_SITE_OTHER): Payer: PPO | Admitting: *Deleted

## 2022-09-13 DIAGNOSIS — Z0000 Encounter for general adult medical examination without abnormal findings: Secondary | ICD-10-CM

## 2022-09-13 NOTE — Patient Instructions (Signed)
Cathy Gallegos , Thank you for taking time to come for your Medicare Wellness Visit. I appreciate your ongoing commitment to your health goals. Please review the following plan we discussed and let me know if I can assist you in the future.   These are the goals we discussed:  Goals      Increase physical activity     Walk 10 minutes at least 3x week      Take a vacation.     Weight (lb) < 150 lb (68 kg)        This is a list of the screening recommended for you and due dates:  Health Maintenance  Topic Date Due   Zoster (Shingles) Vaccine (1 of 2) Never done   COVID-19 Vaccine (3 - Pfizer risk series) 02/23/2020   Mammogram  07/25/2023   Medicare Annual Wellness Visit  09/14/2023   DEXA scan (bone density measurement)  09/19/2023   Colon Cancer Screening  06/29/2025   DTaP/Tdap/Td vaccine (2 - Tdap) 05/01/2027   Pneumonia Vaccine  Completed   Flu Shot  Completed   Hepatitis C Screening: USPSTF Recommendation to screen - Ages 18-79 yo.  Completed   HPV Vaccine  Aged Out     Next appointment: Follow up in one year for your annual wellness visit.   Preventive Care 45 Years and Older, Female Preventive care refers to lifestyle choices and visits with your health care provider that can promote health and wellness. What does preventive care include? A yearly physical exam. This is also called an annual well check. Dental exams once or twice a year. Routine eye exams. Ask your health care provider how often you should have your eyes checked. Personal lifestyle choices, including: Daily care of your teeth and gums. Regular physical activity. Eating a healthy diet. Avoiding tobacco and drug use. Limiting alcohol use. Practicing safe sex. Taking low-dose aspirin every day. Taking vitamin and mineral supplements as recommended by your health care provider. What happens during an annual well check? The services and screenings done by your health care provider during your annual  well check will depend on your age, overall health, lifestyle risk factors, and family history of disease. Counseling  Your health care provider may ask you questions about your: Alcohol use. Tobacco use. Drug use. Emotional well-being. Home and relationship well-being. Sexual activity. Eating habits. History of falls. Memory and ability to understand (cognition). Work and work Astronomer. Reproductive health. Screening  You may have the following tests or measurements: Height, weight, and BMI. Blood pressure. Lipid and cholesterol levels. These may be checked every 5 years, or more frequently if you are over 67 years old. Skin check. Lung cancer screening. You may have this screening every year starting at age 96 if you have a 30-pack-year history of smoking and currently smoke or have quit within the past 15 years. Fecal occult blood test (FOBT) of the stool. You may have this test every year starting at age 73. Flexible sigmoidoscopy or colonoscopy. You may have a sigmoidoscopy every 5 years or a colonoscopy every 10 years starting at age 44. Hepatitis C blood test. Hepatitis B blood test. Sexually transmitted disease (STD) testing. Diabetes screening. This is done by checking your blood sugar (glucose) after you have not eaten for a while (fasting). You may have this done every 1-3 years. Bone density scan. This is done to screen for osteoporosis. You may have this done starting at age 81. Mammogram. This may be done every 1-2  years. Talk to your health care provider about how often you should have regular mammograms. Talk with your health care provider about your test results, treatment options, and if necessary, the need for more tests. Vaccines  Your health care provider may recommend certain vaccines, such as: Influenza vaccine. This is recommended every year. Tetanus, diphtheria, and acellular pertussis (Tdap, Td) vaccine. You may need a Td booster every 10 years. Zoster  vaccine. You may need this after age 47. Pneumococcal 13-valent conjugate (PCV13) vaccine. One dose is recommended after age 70. Pneumococcal polysaccharide (PPSV23) vaccine. One dose is recommended after age 49. Talk to your health care provider about which screenings and vaccines you need and how often you need them. This information is not intended to replace advice given to you by your health care provider. Make sure you discuss any questions you have with your health care provider. Document Released: 10/06/2015 Document Revised: 05/29/2016 Document Reviewed: 07/11/2015 Elsevier Interactive Patient Education  2017 [employer].  Fall Prevention in the Home Falls can cause injuries. They can happen to people of all ages. There are many things you can do to make your home safe and to help prevent falls. What can I do on the outside of my home? Regularly fix the edges of walkways and driveways and fix any cracks. Remove anything that might make you trip as you walk through a door, such as a raised step or threshold. Trim any bushes or trees on the path to your home. Use bright outdoor lighting. Clear any walking paths of anything that might make someone trip, such as rocks or tools. Regularly check to see if handrails are loose or broken. Make sure that both sides of any steps have handrails. Any raised decks and porches should have guardrails on the edges. Have any leaves, snow, or ice cleared regularly. Use sand or salt on walking paths during winter. Clean up any spills in your garage right away. This includes oil or grease spills. What can I do in the bathroom? Use night lights. Install grab bars by the toilet and in the tub and shower. Do not use towel bars as grab bars. Use non-skid mats or decals in the tub or shower. If you need to sit down in the shower, use a plastic, non-slip stool. Keep the floor dry. Clean up any water that spills on the floor as soon as it happens. Remove  soap buildup in the tub or shower regularly. Attach bath mats securely with double-sided non-slip rug tape. Do not have throw rugs and other things on the floor that can make you trip. What can I do in the bedroom? Use night lights. Make sure that you have a light by your bed that is easy to reach. Do not use any sheets or blankets that are too big for your bed. They should not hang down onto the floor. Have a firm chair that has side arms. You can use this for support while you get dressed. Do not have throw rugs and other things on the floor that can make you trip. What can I do in the kitchen? Clean up any spills right away. Avoid walking on wet floors. Keep items that you use a lot in easy-to-reach places. If you need to reach something above you, use a strong step stool that has a grab bar. Keep electrical cords out of the way. Do not use floor polish or wax that makes floors slippery. If you must use wax, use non-skid floor  wax. Do not have throw rugs and other things on the floor that can make you trip. What can I do with my stairs? Do not leave any items on the stairs. Make sure that there are handrails on both sides of the stairs and use them. Fix handrails that are broken or loose. Make sure that handrails are as long as the stairways. Check any carpeting to make sure that it is firmly attached to the stairs. Fix any carpet that is loose or worn. Avoid having throw rugs at the top or bottom of the stairs. If you do have throw rugs, attach them to the floor with carpet tape. Make sure that you have a light switch at the top of the stairs and the bottom of the stairs. If you do not have them, ask someone to add them for you. What else can I do to help prevent falls? Wear shoes that: Do not have high heels. Have rubber bottoms. Are comfortable and fit you well. Are closed at the toe. Do not wear sandals. If you use a stepladder: Make sure that it is fully opened. Do not climb a  closed stepladder. Make sure that both sides of the stepladder are locked into place. Ask someone to hold it for you, if possible. Clearly mark and make sure that you can see: Any grab bars or handrails. First and last steps. Where the edge of each step is. Use tools that help you move around (mobility aids) if they are needed. These include: Canes. Walkers. Scooters. Crutches. Turn on the lights when you go into a dark area. Replace any light bulbs as soon as they burn out. Set up your furniture so you have a clear path. Avoid moving your furniture around. If any of your floors are uneven, fix them. If there are any pets around you, be aware of where they are. Review your medicines with your doctor. Some medicines can make you feel dizzy. This can increase your chance of falling. Ask your doctor what other things that you can do to help prevent falls. This information is not intended to replace advice given to you by your health care provider. Make sure you discuss any questions you have with your health care provider. Document Released: 07/06/2009 Document Revised: 02/15/2016 Document Reviewed: 10/14/2014 Elsevier Interactive Patient Education  2017 [employer].
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
## 2022-09-20 ENCOUNTER — Other Ambulatory Visit: Payer: Self-pay | Admitting: Family Medicine

## 2022-10-03 DIAGNOSIS — S42212D Unspecified displaced fracture of surgical neck of left humerus, subsequent encounter for fracture with routine healing: Secondary | ICD-10-CM | POA: Diagnosis not present

## 2022-10-04 DIAGNOSIS — M25612 Stiffness of left shoulder, not elsewhere classified: Secondary | ICD-10-CM | POA: Diagnosis not present

## 2022-10-04 DIAGNOSIS — M25512 Pain in left shoulder: Secondary | ICD-10-CM | POA: Diagnosis not present

## 2022-10-09 DIAGNOSIS — M25512 Pain in left shoulder: Secondary | ICD-10-CM | POA: Diagnosis not present

## 2022-10-09 DIAGNOSIS — M25612 Stiffness of left shoulder, not elsewhere classified: Secondary | ICD-10-CM | POA: Diagnosis not present

## 2022-10-14 ENCOUNTER — Inpatient Hospital Stay: Payer: PPO | Attending: Hematology & Oncology

## 2022-10-14 ENCOUNTER — Inpatient Hospital Stay: Payer: PPO | Admitting: Family

## 2022-10-16 DIAGNOSIS — M25512 Pain in left shoulder: Secondary | ICD-10-CM | POA: Diagnosis not present

## 2022-10-16 DIAGNOSIS — M25612 Stiffness of left shoulder, not elsewhere classified: Secondary | ICD-10-CM | POA: Diagnosis not present

## 2022-10-25 ENCOUNTER — Telehealth: Payer: Self-pay | Admitting: Family Medicine

## 2022-10-25 DIAGNOSIS — L989 Disorder of the skin and subcutaneous tissue, unspecified: Secondary | ICD-10-CM

## 2022-10-25 NOTE — Telephone Encounter (Signed)
Patient would like a referral for a dermatologist, she states she has spot on her face that she wants to get checked out. Please advise.

## 2022-10-28 ENCOUNTER — Encounter: Payer: Self-pay | Admitting: Family Medicine

## 2022-10-29 DIAGNOSIS — B078 Other viral warts: Secondary | ICD-10-CM | POA: Diagnosis not present

## 2022-10-30 DIAGNOSIS — M25612 Stiffness of left shoulder, not elsewhere classified: Secondary | ICD-10-CM | POA: Diagnosis not present

## 2022-10-30 DIAGNOSIS — M25512 Pain in left shoulder: Secondary | ICD-10-CM | POA: Diagnosis not present

## 2022-10-31 DIAGNOSIS — S42212S Unspecified displaced fracture of surgical neck of left humerus, sequela: Secondary | ICD-10-CM | POA: Diagnosis not present

## 2022-11-05 NOTE — Telephone Encounter (Signed)
Pt called to advise that she has already found a dermatologist and had the issue that she was concerned about taken care of.

## 2022-11-06 DIAGNOSIS — M25612 Stiffness of left shoulder, not elsewhere classified: Secondary | ICD-10-CM | POA: Diagnosis not present

## 2022-11-06 DIAGNOSIS — M25512 Pain in left shoulder: Secondary | ICD-10-CM | POA: Diagnosis not present

## 2022-11-18 ENCOUNTER — Encounter: Payer: Self-pay | Admitting: Family

## 2022-11-18 ENCOUNTER — Inpatient Hospital Stay: Payer: PPO | Attending: Hematology & Oncology

## 2022-11-18 ENCOUNTER — Inpatient Hospital Stay (HOSPITAL_BASED_OUTPATIENT_CLINIC_OR_DEPARTMENT_OTHER): Payer: PPO | Admitting: Family

## 2022-11-18 VITALS — BP 145/75 | HR 78 | Temp 98.1°F | Resp 17 | Wt 175.1 lb

## 2022-11-18 DIAGNOSIS — Z79899 Other long term (current) drug therapy: Secondary | ICD-10-CM | POA: Insufficient documentation

## 2022-11-18 DIAGNOSIS — Z888 Allergy status to other drugs, medicaments and biological substances status: Secondary | ICD-10-CM | POA: Diagnosis not present

## 2022-11-18 DIAGNOSIS — D649 Anemia, unspecified: Secondary | ICD-10-CM

## 2022-11-18 DIAGNOSIS — D509 Iron deficiency anemia, unspecified: Secondary | ICD-10-CM | POA: Insufficient documentation

## 2022-11-18 LAB — CBC WITH DIFFERENTIAL (CANCER CENTER ONLY)
Abs Immature Granulocytes: 0.08 10*3/uL — ABNORMAL HIGH (ref 0.00–0.07)
Basophils Absolute: 0 10*3/uL (ref 0.0–0.1)
Basophils Relative: 0 %
Eosinophils Absolute: 0.2 10*3/uL (ref 0.0–0.5)
Eosinophils Relative: 3 %
HCT: 37.5 % (ref 36.0–46.0)
Hemoglobin: 11.7 g/dL — ABNORMAL LOW (ref 12.0–15.0)
Immature Granulocytes: 1 %
Lymphocytes Relative: 33 %
Lymphs Abs: 2.5 10*3/uL (ref 0.7–4.0)
MCH: 25.4 pg — ABNORMAL LOW (ref 26.0–34.0)
MCHC: 31.2 g/dL (ref 30.0–36.0)
MCV: 81.3 fL (ref 80.0–100.0)
Monocytes Absolute: 0.5 10*3/uL (ref 0.1–1.0)
Monocytes Relative: 6 %
Neutro Abs: 4.4 10*3/uL (ref 1.7–7.7)
Neutrophils Relative %: 57 %
Platelet Count: 272 10*3/uL (ref 150–400)
RBC: 4.61 MIL/uL (ref 3.87–5.11)
RDW: 16.1 % — ABNORMAL HIGH (ref 11.5–15.5)
WBC Count: 7.7 10*3/uL (ref 4.0–10.5)
nRBC: 0 % (ref 0.0–0.2)

## 2022-11-18 LAB — RETICULOCYTES
Immature Retic Fract: 11.3 % (ref 2.3–15.9)
RBC.: 4.6 MIL/uL (ref 3.87–5.11)
Retic Count, Absolute: 62.1 10*3/uL (ref 19.0–186.0)
Retic Ct Pct: 1.4 % (ref 0.4–3.1)

## 2022-11-18 LAB — FERRITIN: Ferritin: 27 ng/mL (ref 11–307)

## 2022-11-18 IMAGING — DX DG CHEST 2V
2 series · 2 of 2 positions shown · non-contrast
Comparison: Radiograph 06/03/2018, lumbar spine MRI 09/08/2015

CLINICAL DATA: Cough and malaise

EXAM:
CHEST - 2 VIEW

[chest pa]
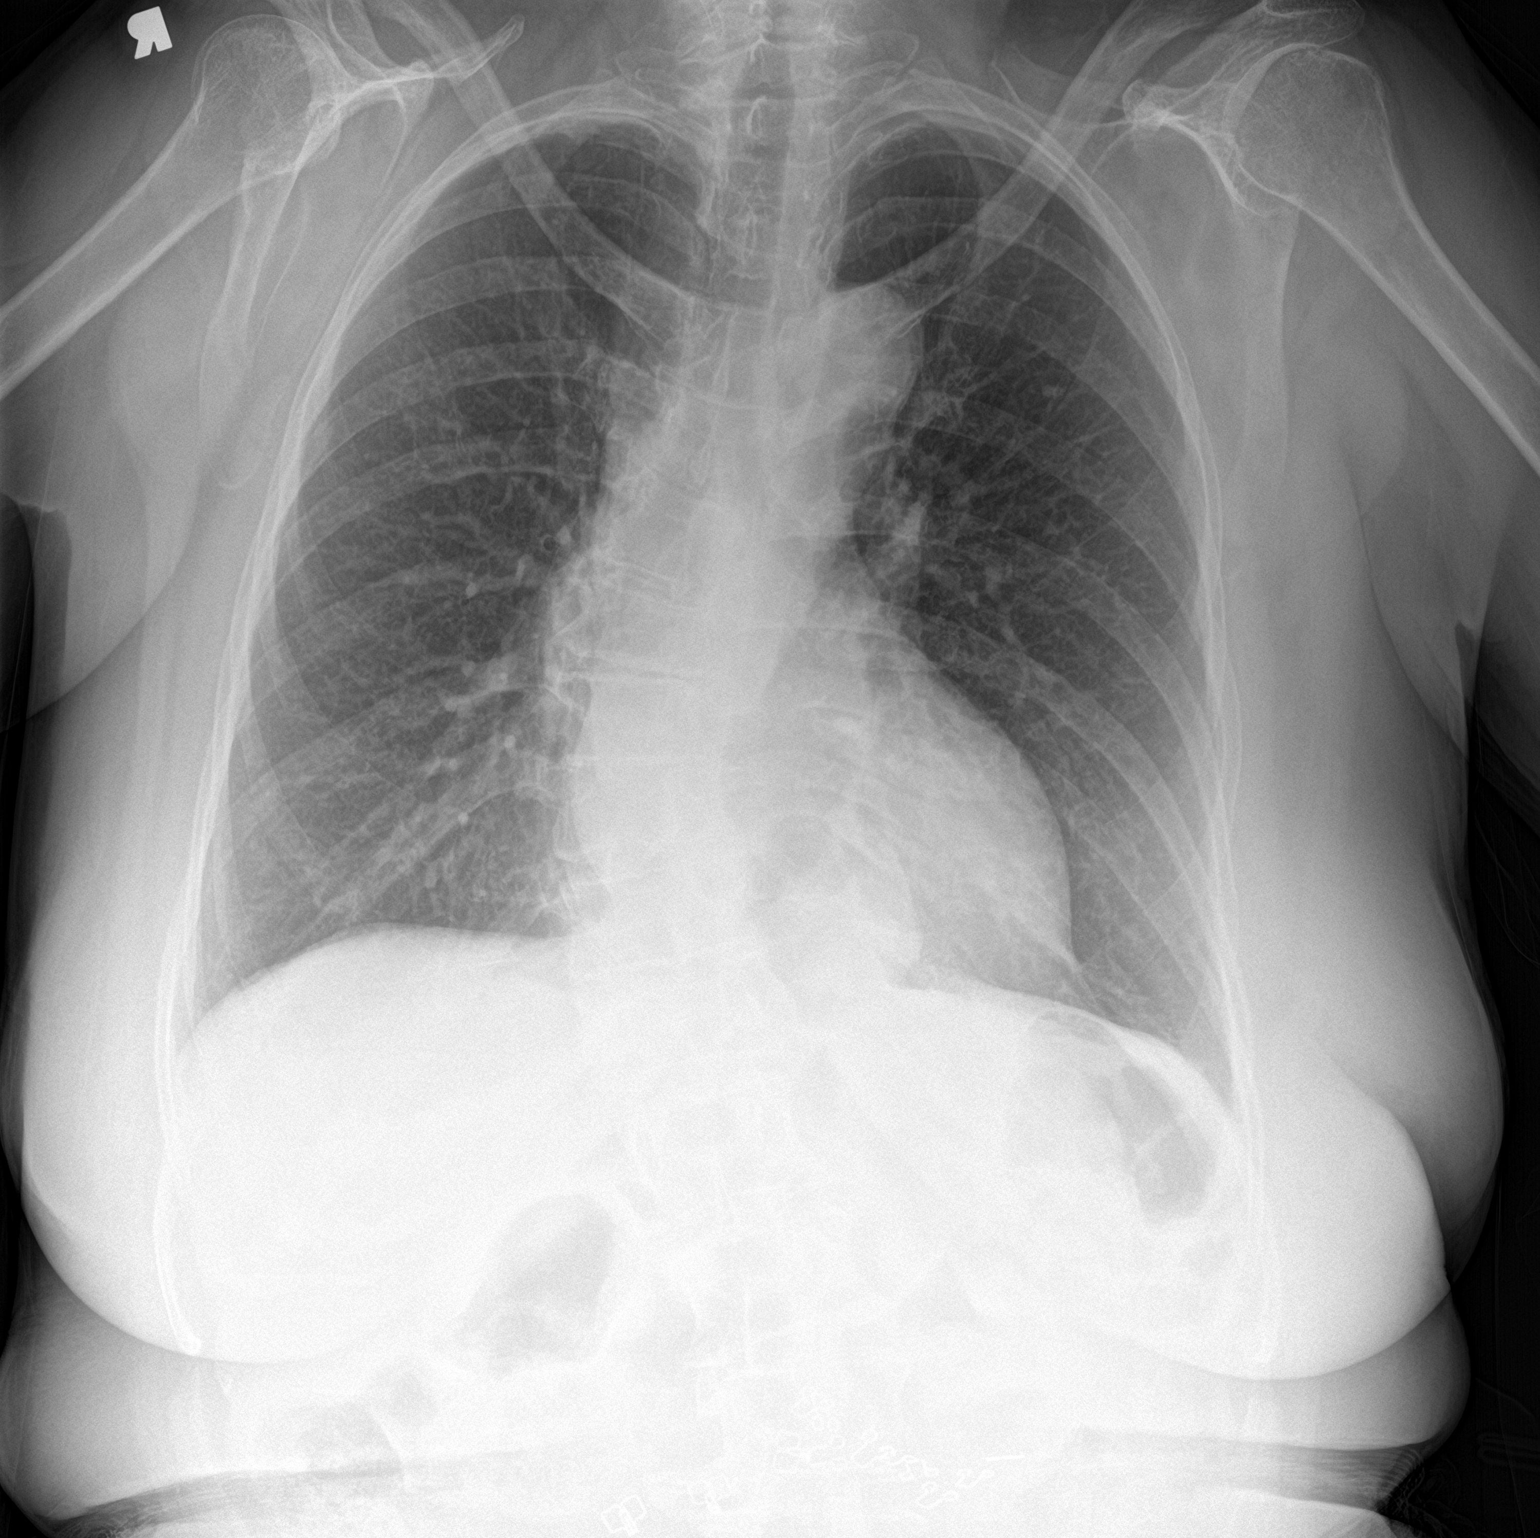

[chest lat]
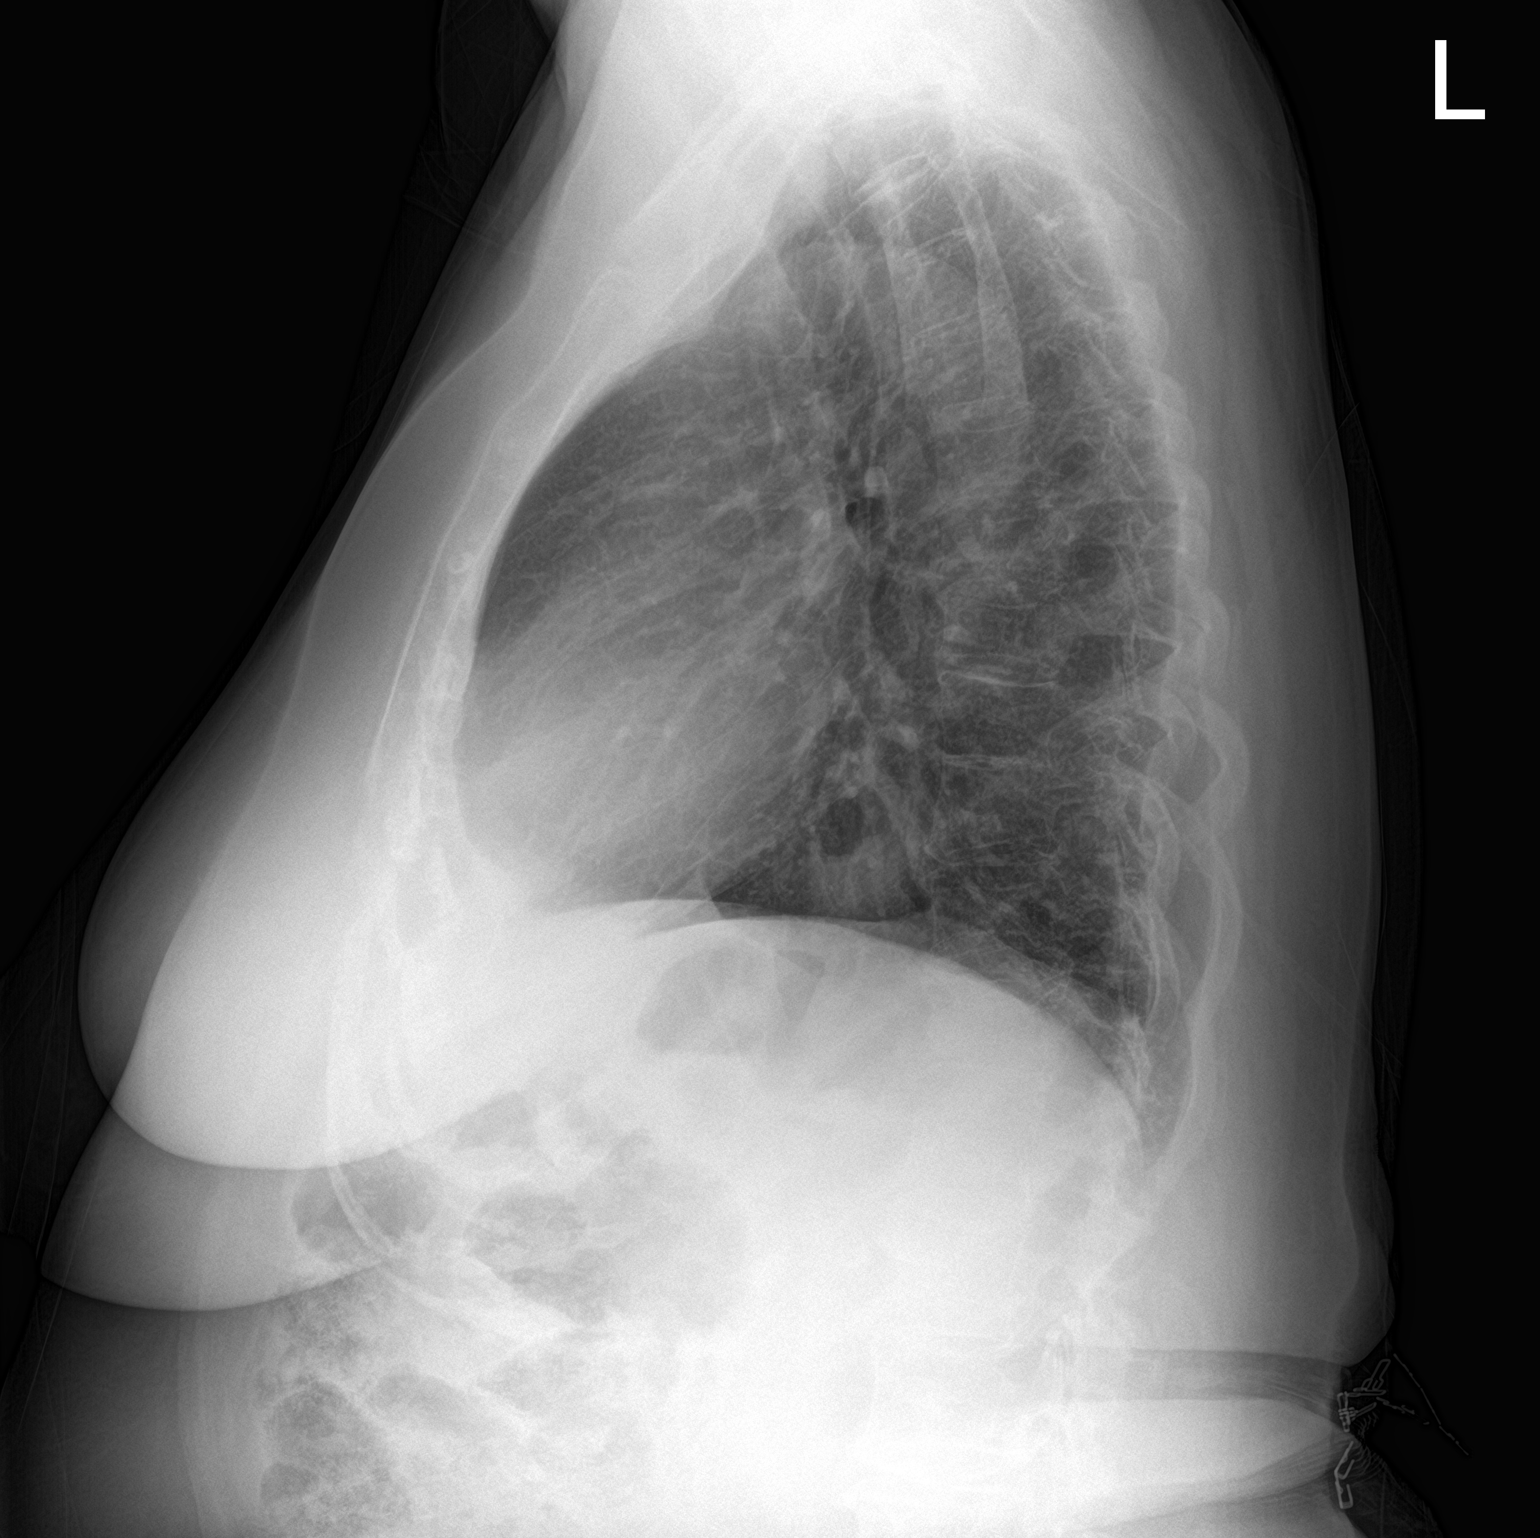

[2 of 2 positions shown; findings below may reference images not displayed]

FINDINGS: Unchanged cardiomediastinal silhouette. There is no focal airspace
consolidation. There is no pleural effusion. No pneumothorax. There
is a moderate size hiatal hernia. There are chronic midthoracic and
T12 compression deformities. Thoracic spondylosis with a
dextroconvex thoracic curvature. Bilateral shoulder degenerative
changes.
IMPRESSION: No evidence of acute cardiopulmonary disease.

Moderate size hiatal hernia.

## 2022-11-18 NOTE — Progress Notes (Signed)
Hematology and Oncology Follow Up Visit  Cathy Gallegos [id number] 08-Jul-1951 72 y.o. 11/18/2022   Principle Diagnosis:  Iron deficiency anemia   Current Therapy:   IV iron as indicated    Interim History:  Cathy Gallegos is here today for follow-up. She is doing well. She has recuperated nicely since she fell last November with proximal humerus fracture and questionable radial head fracture. She states that thankfully she did not require surgery and wore a sling.  She has not had any new falls or syncope.  No swelling, numbness or tingling in her extremities at this time.  She tolerated Monoferric nicely.  No blood loss, bruising or petechiae noted.  No fever, chills, n/v, cough, rash, dizziness, SOB, chest pain, palpitations, abdominal pain or changes in bowel or bladder habits.  Appetite and hydration are good. Weight is stable at 175 lbs.   ECOG Performance Status: 0 - Asymptomatic  Medications:  Allergies as of 11/18/2022       Reactions   Dust Mite Mixed Allergen Ext [mite (d. Farinae)]    Gabapentin Nausea Only   Latex Swelling, Other (See Comments)   Molds & Smuts         Medication List        Accurate as of November 18, 2022  1:35 PM. If you have any questions, ask your nurse or doctor.          EPINEPHrine 0.3 mg/0.3 mL Soaj injection Commonly known as: EpiPen 2-Pak Inject 0.3 mg into the muscle as needed for anaphylaxis.   PARoxetine 40 MG tablet Commonly known as: PAXIL TAKE 1 TABLET(40 MG) BY MOUTH DAILY        Allergies:  Allergies  Allergen Reactions   Dust Mite Mixed Allergen Ext [Mite (D. Farinae)]    Gabapentin Nausea Only   Latex Swelling and Other (See Comments)   Molds & Smuts     Past Medical History, Surgical history, Social history, and Family History were reviewed and updated.  Review of Systems: All other 10 point review of systems is negative.   Physical Exam:  vitals were not taken for this visit.   Wt Readings from Last 3  Encounters:  08/16/22 175 lb (79.4 kg)  05/13/22 175 lb 8 oz (79.6 kg)  03/07/22 177 lb 12.8 oz (80.6 kg)    Ocular: Sclerae unicteric, pupils equal, round and reactive to light Ear-nose-throat: Oropharynx clear, dentition fair Lymphatic: No cervical or supraclavicular adenopathy Lungs no rales or rhonchi, good excursion bilaterally Heart regular rate and rhythm, no murmur appreciated Abd soft, nontender, positive bowel sounds MSK no focal spinal tenderness, no joint edema Neuro: non-focal, well-oriented, appropriate affect Breasts: Deferred   Lab Results  Component Value Date   WBC 8.1 08/14/2022   HGB 10.3 (L) 08/14/2022   HCT 33.7 (L) 08/14/2022   MCV 75.9 (L) 08/14/2022   PLT 291 08/14/2022   Lab Results  Component Value Date   FERRITIN 5.6 (L) 07/25/2022   IRON 43 08/14/2022   TIBC 526 (H) 08/14/2022   UIBC 483 (H) 08/14/2022   IRONPCTSAT 8 (L) 08/14/2022   Lab Results  Component Value Date   RETICCTPCT 1.4 08/14/2022   RBC 4.46 08/14/2022   No results found for: "KPAFRELGTCHN", "LAMBDASER", "KAPLAMBRATIO" No results found for: "IGGSERUM", "IGA", "IGMSERUM" No results found for: "TOTALPROTELP", "ALBUMINELP", "A1GS", "A2GS", "BETS", "BETA2SER", "GAMS", "MSPIKE", "SPEI"   Chemistry      Component Value Date/Time   NA 140 08/14/2022 1008   NA 142  09/19/2020 1129   K 3.9 08/14/2022 1008   CL 106 08/14/2022 1008   CO2 26 08/14/2022 1008   BUN 22 08/14/2022 1008   BUN 18 09/19/2020 1129   CREATININE 0.85 08/14/2022 1008   CREATININE 0.78 06/16/2015 1005      Component Value Date/Time   CALCIUM 9.4 08/14/2022 1008   ALKPHOS 96 08/14/2022 1008   AST 12 (L) 08/14/2022 1008   ALT 8 08/14/2022 1008   BILITOT 0.3 08/14/2022 1008       Impression and Plan: Cathy Gallegos is a very pleasant 72 yo caucasian female with recent iron deficiency anemia.   Iron studies are pending. We will replace if needed.  Follow-up in 4 months.   Lottie Dawson, NP 2/26/20241:35  PM

## 2022-11-19 LAB — IRON AND IRON BINDING CAPACITY (CC-WL,HP ONLY)
Iron: 49 ug/dL (ref 28–170)
Saturation Ratios: 12 % (ref 10.4–31.8)
TIBC: 396 ug/dL (ref 250–450)
UIBC: 347 ug/dL (ref 148–442)

## 2022-11-21 DIAGNOSIS — M25512 Pain in left shoulder: Secondary | ICD-10-CM | POA: Diagnosis not present

## 2022-11-21 DIAGNOSIS — M25612 Stiffness of left shoulder, not elsewhere classified: Secondary | ICD-10-CM | POA: Diagnosis not present

## 2022-11-27 DIAGNOSIS — M25612 Stiffness of left shoulder, not elsewhere classified: Secondary | ICD-10-CM | POA: Diagnosis not present

## 2022-11-27 DIAGNOSIS — M25512 Pain in left shoulder: Secondary | ICD-10-CM | POA: Diagnosis not present

## 2022-12-04 DIAGNOSIS — H524 Presbyopia: Secondary | ICD-10-CM | POA: Diagnosis not present

## 2022-12-04 DIAGNOSIS — H2512 Age-related nuclear cataract, left eye: Secondary | ICD-10-CM | POA: Diagnosis not present

## 2022-12-04 DIAGNOSIS — Z961 Presence of intraocular lens: Secondary | ICD-10-CM | POA: Diagnosis not present

## 2022-12-04 DIAGNOSIS — H18593 Other hereditary corneal dystrophies, bilateral: Secondary | ICD-10-CM | POA: Diagnosis not present

## 2022-12-04 DIAGNOSIS — M25612 Stiffness of left shoulder, not elsewhere classified: Secondary | ICD-10-CM | POA: Diagnosis not present

## 2022-12-04 DIAGNOSIS — M25512 Pain in left shoulder: Secondary | ICD-10-CM | POA: Diagnosis not present

## 2022-12-11 DIAGNOSIS — M25612 Stiffness of left shoulder, not elsewhere classified: Secondary | ICD-10-CM | POA: Diagnosis not present

## 2022-12-11 DIAGNOSIS — M25512 Pain in left shoulder: Secondary | ICD-10-CM | POA: Diagnosis not present

## 2022-12-31 DIAGNOSIS — S42212D Unspecified displaced fracture of surgical neck of left humerus, subsequent encounter for fracture with routine healing: Secondary | ICD-10-CM | POA: Diagnosis not present

## 2023-01-27 DIAGNOSIS — D2261 Melanocytic nevi of right upper limb, including shoulder: Secondary | ICD-10-CM | POA: Diagnosis not present

## 2023-01-27 DIAGNOSIS — D2271 Melanocytic nevi of right lower limb, including hip: Secondary | ICD-10-CM | POA: Diagnosis not present

## 2023-01-27 DIAGNOSIS — L813 Cafe au lait spots: Secondary | ICD-10-CM | POA: Diagnosis not present

## 2023-01-27 DIAGNOSIS — D225 Melanocytic nevi of trunk: Secondary | ICD-10-CM | POA: Diagnosis not present

## 2023-01-27 DIAGNOSIS — L821 Other seborrheic keratosis: Secondary | ICD-10-CM | POA: Diagnosis not present

## 2023-01-27 DIAGNOSIS — D2262 Melanocytic nevi of left upper limb, including shoulder: Secondary | ICD-10-CM | POA: Diagnosis not present

## 2023-01-27 DIAGNOSIS — L918 Other hypertrophic disorders of the skin: Secondary | ICD-10-CM | POA: Diagnosis not present

## 2023-01-27 DIAGNOSIS — D1801 Hemangioma of skin and subcutaneous tissue: Secondary | ICD-10-CM | POA: Diagnosis not present

## 2023-02-12 ENCOUNTER — Other Ambulatory Visit: Payer: Self-pay | Admitting: Family Medicine

## 2023-03-03 NOTE — Patient Instructions (Incomplete)
It was good to see you again today Recommend the shingles vaccine series if not done already  Please call [facility] and make an appt to be seen for your knee pain  Second Floor, [address]  [phone]) [phone]  I will be in touch with your labs We will make sure not sign of a bladder infection.  We can have you seen by urology here at the [facility] to see if they can help with your urinary incontinence
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
## 2023-03-06 ENCOUNTER — Ambulatory Visit: Payer: PPO | Admitting: Family Medicine

## 2023-03-06 ENCOUNTER — Ambulatory Visit (INDEPENDENT_AMBULATORY_CARE_PROVIDER_SITE_OTHER): Payer: PPO | Admitting: Family Medicine

## 2023-03-06 ENCOUNTER — Encounter: Payer: Self-pay | Admitting: Family

## 2023-03-06 VITALS — BP 124/72 | HR 78 | Temp 97.8°F | Resp 18 | Ht 65.0 in | Wt 178.8 lb

## 2023-03-06 DIAGNOSIS — Z13220 Encounter for screening for lipoid disorders: Secondary | ICD-10-CM | POA: Diagnosis not present

## 2023-03-06 DIAGNOSIS — M25562 Pain in left knee: Secondary | ICD-10-CM | POA: Diagnosis not present

## 2023-03-06 DIAGNOSIS — R7303 Prediabetes: Secondary | ICD-10-CM

## 2023-03-06 DIAGNOSIS — G8929 Other chronic pain: Secondary | ICD-10-CM | POA: Diagnosis not present

## 2023-03-06 DIAGNOSIS — D649 Anemia, unspecified: Secondary | ICD-10-CM | POA: Diagnosis not present

## 2023-03-06 DIAGNOSIS — R5383 Other fatigue: Secondary | ICD-10-CM | POA: Diagnosis not present

## 2023-03-06 DIAGNOSIS — H9190 Unspecified hearing loss, unspecified ear: Secondary | ICD-10-CM

## 2023-03-06 DIAGNOSIS — M25561 Pain in right knee: Secondary | ICD-10-CM

## 2023-03-06 DIAGNOSIS — R32 Unspecified urinary incontinence: Secondary | ICD-10-CM | POA: Diagnosis not present

## 2023-03-06 LAB — POCT URINALYSIS DIP (MANUAL ENTRY)
Bilirubin, UA: NEGATIVE
Blood, UA: NEGATIVE
Glucose, UA: NEGATIVE mg/dL
Ketones, POC UA: NEGATIVE mg/dL
Nitrite, UA: NEGATIVE
Protein Ur, POC: NEGATIVE mg/dL
Spec Grav, UA: 1.015 (ref 1.010–1.025)
Urobilinogen, UA: 0.2 E.U./dL
pH, UA: 6 (ref 5.0–8.0)

## 2023-03-06 MED ORDER — PAROXETINE HCL 40 MG PO TABS: 40.0000 mg | ORAL_TABLET | Freq: Every day | ORAL | 3 refills | Status: DC

## 2023-03-07 ENCOUNTER — Encounter: Payer: Self-pay | Admitting: Family Medicine

## 2023-03-07 LAB — CBC
HCT: 34.8 % — ABNORMAL LOW (ref 36.0–46.0)
Hemoglobin: 10.7 g/dL — ABNORMAL LOW (ref 12.0–15.0)
MCHC: 30.8 g/dL (ref 30.0–36.0)
MCV: 78 fl (ref 78.0–100.0)
Platelets: 268 10*3/uL (ref 150.0–400.0)
RBC: 4.46 Mil/uL (ref 3.87–5.11)
RDW: 16.5 % — ABNORMAL HIGH (ref 11.5–15.5)
WBC: 8.1 10*3/uL (ref 4.0–10.5)

## 2023-03-07 LAB — COMPREHENSIVE METABOLIC PANEL
ALT: 8 U/L (ref 0–35)
AST: 12 U/L (ref 0–37)
Albumin: 3.8 g/dL (ref 3.5–5.2)
Alkaline Phosphatase: 99 U/L (ref 39–117)
BUN: 24 mg/dL — ABNORMAL HIGH (ref 6–23)
CO2: 28 mEq/L (ref 19–32)
Calcium: 9.2 mg/dL (ref 8.4–10.5)
Chloride: 104 mEq/L (ref 96–112)
Creatinine, Ser: 1.01 mg/dL (ref 0.40–1.20)
GFR: 55.59 mL/min — ABNORMAL LOW (ref >60.00)
Glucose, Bld: 130 mg/dL — ABNORMAL HIGH (ref 70–99)
Potassium: 4.1 mEq/L (ref 3.5–5.1)
Sodium: 142 mEq/L (ref 135–145)
Total Bilirubin: 0.4 mg/dL (ref 0.2–1.2)
Total Protein: 6.1 g/dL (ref 6.0–8.3)

## 2023-03-07 LAB — LIPID PANEL
Cholesterol: 161 mg/dL (ref 0–200)
HDL: 54.4 mg/dL (ref >39.00)
LDL Cholesterol: 91 mg/dL (ref 0–99)
NonHDL: 107.07
Total CHOL/HDL Ratio: 3
Triglycerides: 80 mg/dL (ref 0.0–149.0)
VLDL: 16 mg/dL (ref 0.0–40.0)

## 2023-03-07 LAB — TSH: TSH: 3.87 u[IU]/mL (ref 0.35–5.50)

## 2023-03-07 LAB — HEMOGLOBIN A1C: Hgb A1c MFr Bld: 5.6 % (ref 4.6–6.5)

## 2023-03-08 ENCOUNTER — Encounter: Payer: Self-pay | Admitting: Family Medicine

## 2023-03-08 LAB — URINE CULTURE
MICRO NUMBER:: 15079105
SPECIMEN QUALITY:: ADEQUATE

## 2023-03-08 MED ORDER — CEPHALEXIN 500 MG PO CAPS: 500.0000 mg | ORAL_CAPSULE | Freq: Two times a day (BID) | ORAL | 0 refills | Status: DC

## 2023-03-08 NOTE — Addendum Note (Signed)
Addended by: Abbe Amsterdam C on: 03/08/2023 05:42 PM   Modules accepted: Orders

## 2023-03-10 ENCOUNTER — Telehealth: Payer: Self-pay

## 2023-03-10 NOTE — Telephone Encounter (Signed)
Initial Comment Caller states she received a call from the office. She was waiting on lab results. Caller would like to page the on call provider Translation No Nurse Assessment Nurse: Konrad Dolores, RN, [employer] [facility] Date/Time Lamount Cohen Time): 03/08/2023 7:14:41 PM Confirm and document reason for call. If symptomatic, describe symptoms. ---States saw the MD and had labs and urine test and today she missed a call from the office. Thought might have been calling results. Does the patient have any new or worsening symptoms? ---Yes Will a triage be completed? ---Yes Related visit to physician within the last 2 weeks? ---Yes Does the PT have any chronic conditions? (i.e. diabetes, asthma, this includes High risk factors for pregnancy, etc.) ---No Is this a behavioral health or substance abuse call? ---No Guidelines Guideline Title Affirmed Question Affirmed Notes Nurse Date/Time Lamount Cohen Time) Recent Medical Visit for Illness Follow-up Call [1] Recent medical visit within 24 hours AND [2] condition / symptoms SAME (unchanged) AND [3] caller has additional questions triager can answer Janina Mayo, [place] 03/08/2023 7:17:21 PM Disp. Time Lamount Cohen Time) Disposition Final User 03/08/2023 7:05:48 PM Send To Nurse Maudry Mayhew, RN, Nicola Girt NOTE: All timestamps contained within this report are represented as [place] Standard Time. CONFIDENTIALTY NOTICE: This fax transmission is intended only for the addressee. It contains information that is legally privileged, confidential or otherwise protected from use or disclosure. If you are not the intended recipient, you are strictly prohibited from reviewing, disclosing, copying using or disseminating any of this information or taking any action in reliance on or regarding this information. If you have received this fax in error, please notify us immediately by telephone so that we can arrange for its return to [place]. Phone: [phone], Toll-Free:  [phone], Fax: [phone] Page: 2 of 2 Call Id: [id number] Disp. Time Lamount Cohen Time) Disposition Final User 03/08/2023 7:20:19 PM Home Care Yes Konrad Dolores, RN, [place] [facility] Final Disposition 03/08/2023 7:20:19 PM Home Care Yes Konrad Dolores, RN, [place] [facility] Caller Disagree/Comply Comply Caller Understands Yes PreDisposition InappropriateToAsk Care Advice Given Per Guideline HOME CARE: * You should be able to treat this at home. CALL BACK IF: * You become worse CARE ADVICE given per Recent Medical Visit for Illness: Follow-Up Call (Adult) guideline

## 2023-03-10 NOTE — Telephone Encounter (Signed)
Tried calling to give results- vmb was full. Will try again.   Letter has also been mailed.

## 2023-03-12 NOTE — Telephone Encounter (Signed)
Lab results given to patient.

## 2023-03-12 NOTE — Telephone Encounter (Signed)
Tied calling pt but vm was full and was unable to leave message.

## 2023-03-19 ENCOUNTER — Encounter: Payer: Self-pay | Admitting: Hematology & Oncology

## 2023-03-19 ENCOUNTER — Inpatient Hospital Stay: Payer: PPO

## 2023-03-19 ENCOUNTER — Inpatient Hospital Stay: Payer: PPO | Attending: Hematology & Oncology

## 2023-03-19 ENCOUNTER — Inpatient Hospital Stay (HOSPITAL_BASED_OUTPATIENT_CLINIC_OR_DEPARTMENT_OTHER): Payer: PPO | Admitting: Hematology & Oncology

## 2023-03-19 VITALS — BP 140/71 | HR 80 | Temp 98.0°F | Resp 20 | Ht 65.0 in | Wt 179.4 lb

## 2023-03-19 DIAGNOSIS — R531 Weakness: Secondary | ICD-10-CM | POA: Diagnosis not present

## 2023-03-19 DIAGNOSIS — Z79899 Other long term (current) drug therapy: Secondary | ICD-10-CM | POA: Insufficient documentation

## 2023-03-19 DIAGNOSIS — R635 Abnormal weight gain: Secondary | ICD-10-CM | POA: Insufficient documentation

## 2023-03-19 DIAGNOSIS — D500 Iron deficiency anemia secondary to blood loss (chronic): Secondary | ICD-10-CM | POA: Diagnosis not present

## 2023-03-19 DIAGNOSIS — D509 Iron deficiency anemia, unspecified: Secondary | ICD-10-CM | POA: Diagnosis not present

## 2023-03-19 DIAGNOSIS — Z888 Allergy status to other drugs, medicaments and biological substances status: Secondary | ICD-10-CM | POA: Diagnosis not present

## 2023-03-19 DIAGNOSIS — R0602 Shortness of breath: Secondary | ICD-10-CM | POA: Diagnosis not present

## 2023-03-19 DIAGNOSIS — M791 Myalgia, unspecified site: Secondary | ICD-10-CM | POA: Insufficient documentation

## 2023-03-19 DIAGNOSIS — Z1239 Encounter for other screening for malignant neoplasm of breast: Secondary | ICD-10-CM

## 2023-03-19 DIAGNOSIS — I739 Peripheral vascular disease, unspecified: Secondary | ICD-10-CM | POA: Diagnosis not present

## 2023-03-19 DIAGNOSIS — R5383 Other fatigue: Secondary | ICD-10-CM | POA: Insufficient documentation

## 2023-03-19 LAB — CBC WITH DIFFERENTIAL (CANCER CENTER ONLY)
Abs Immature Granulocytes: 0.04 10*3/uL (ref 0.00–0.07)
Basophils Absolute: 0 10*3/uL (ref 0.0–0.1)
Basophils Relative: 1 %
Eosinophils Absolute: 0.2 10*3/uL (ref 0.0–0.5)
Eosinophils Relative: 3 %
HCT: 33 % — ABNORMAL LOW (ref 36.0–46.0)
Hemoglobin: 10 g/dL — ABNORMAL LOW (ref 12.0–15.0)
Immature Granulocytes: 1 %
Lymphocytes Relative: 33 %
Lymphs Abs: 2.4 10*3/uL (ref 0.7–4.0)
MCH: 24 pg — ABNORMAL LOW (ref 26.0–34.0)
MCHC: 30.3 g/dL (ref 30.0–36.0)
MCV: 79.3 fL — ABNORMAL LOW (ref 80.0–100.0)
Monocytes Absolute: 0.3 10*3/uL (ref 0.1–1.0)
Monocytes Relative: 4 %
Neutro Abs: 4.3 10*3/uL (ref 1.7–7.7)
Neutrophils Relative %: 58 %
Platelet Count: 281 10*3/uL (ref 150–400)
RBC: 4.16 MIL/uL (ref 3.87–5.11)
RDW: 15.5 % (ref 11.5–15.5)
WBC Count: 7.2 10*3/uL (ref 4.0–10.5)
nRBC: 0 % (ref 0.0–0.2)

## 2023-03-19 LAB — FERRITIN: Ferritin: 6 ng/mL — ABNORMAL LOW (ref 11–307)

## 2023-03-19 LAB — IRON AND IRON BINDING CAPACITY (CC-WL,HP ONLY)
Iron: 36 ug/dL (ref 28–170)
Saturation Ratios: 8 % — ABNORMAL LOW (ref 10.4–31.8)
TIBC: 470 ug/dL — ABNORMAL HIGH (ref 250–450)
UIBC: 434 ug/dL (ref 148–442)

## 2023-03-19 LAB — RETICULOCYTES
Immature Retic Fract: 12.3 % (ref 2.3–15.9)
RBC.: 4.17 MIL/uL (ref 3.87–5.11)
Retic Count, Absolute: 61.3 10*3/uL (ref 19.0–186.0)
Retic Ct Pct: 1.5 % (ref 0.4–3.1)

## 2023-03-19 NOTE — Progress Notes (Signed)
Hematology and Oncology Follow Up Visit  Cathy Gallegos [id number] [birth year] 72 y.o. 03/19/2023   Principle Diagnosis:  Iron deficiency anemia   Current Therapy:   IV iron as indicated  --Monoferric given on 08/21/2022   Interim History:  Cathy Gallegos is here today for follow-up.  This is my first visit with her.  She has been seen Cathy Gallegos.  She feels tired.  She has some weakness in her legs.  She feels that her iron is on the low side..  She last got iron about 6 or 7 months ago.  I am sure that her iron is on the low side again.  Last iron studies that we have on her back in February showed a ferritin of 27 with iron saturation of 12%.  She has had no obvious bleeding.  There has been no change in bowel or bladder habits.  She has had no cough or shortness of breath.  She has had no mouth sores.  She does not chew ice.  She does not remember her last mammogram.  She thinks it was 2 years ago.  She has had no problems with recovery from the fall that fracture of the left proximal humerus.  I think she took some physical therapy for this.  She is not able to really exercise because she feels tired.  Because of this, she is gained weight.  Overall, I would say that her performance status is probably ECOG 1.  Medications:  Allergies as of 03/19/2023       Reactions   Dust Mite Mixed Allergen Ext [mite (d. Farinae)]    Gabapentin Nausea Only   Latex Swelling, Other (See Comments)   Molds & Smuts         Medication List        Accurate as of March 19, 2023 12:57 PM. If you have any questions, ask your nurse or doctor.          cephALEXin 500 MG capsule Commonly known as: KEFLEX Take 1 capsule (500 mg total) by mouth 2 (two) times daily.   EPINEPHrine 0.3 mg/0.3 mL Soaj injection Commonly known as: EpiPen 2-Pak Inject 0.3 mg into the muscle as needed for anaphylaxis.   ibuprofen 200 MG tablet Commonly known as: ADVIL Take 200 mg by mouth every 8 (eight) hours as  needed.   PARoxetine 40 MG tablet Commonly known as: PAXIL Take 1 tablet (40 mg total) by mouth daily.        Allergies:  Allergies  Allergen Reactions   Dust Mite Mixed Allergen Ext [Mite (D. Farinae)]    Gabapentin Nausea Only   Latex Swelling and Other (See Comments)   Molds & Smuts     Past Medical History, Surgical history, Social history, and Family History were reviewed and updated.  Review of Systems: Review of Systems  Constitutional:  Positive for malaise/fatigue.  HENT: Negative.    Eyes: Negative.   Respiratory:  Positive for shortness of breath.   Cardiovascular:  Positive for claudication.  Gastrointestinal: Negative.   Genitourinary: Negative.   Musculoskeletal:  Positive for myalgias.  Skin: Negative.   Neurological: Negative.   Endo/Heme/Allergies: Negative.   Psychiatric/Behavioral: Negative.       Physical Exam:  height is 5\' 5"  (1.651 m) and weight is 179 lb 6.4 oz (81.4 kg). Her oral temperature is 98 F (36.7 C). Her blood pressure is 140/71 (abnormal) and her pulse is 80. Her respiration is 20 and oxygen saturation is 98%.  Wt Readings from Last 3 Encounters:  03/19/23 179 lb 6.4 oz (81.4 kg)  03/06/23 178 lb 12.8 oz (81.1 kg)  11/18/22 175 lb 1.9 oz (79.4 kg)    Physical Exam Vitals reviewed.  HENT:     Head: Normocephalic and atraumatic.  Eyes:     Pupils: Pupils are equal, round, and reactive to light.  Cardiovascular:     Rate and Rhythm: Normal rate and regular rhythm.     Heart sounds: Normal heart sounds.  Pulmonary:     Effort: Pulmonary effort is normal.     Breath sounds: Normal breath sounds.  Abdominal:     General: Bowel sounds are normal.     Palpations: Abdomen is soft.  Musculoskeletal:        General: No tenderness or deformity. Normal range of motion.     Cervical back: Normal range of motion.  Lymphadenopathy:     Cervical: No cervical adenopathy.  Skin:    General: Skin is warm and dry.     Findings: No  erythema or rash.  Neurological:     Mental Status: She is alert and oriented to person, place, and time.  Psychiatric:        Behavior: Behavior normal.        Thought Content: Thought content normal.        Judgment: Judgment normal.      Lab Results  Component Value Date   WBC 7.2 03/19/2023   HGB 10.0 (L) 03/19/2023   HCT 33.0 (L) 03/19/2023   MCV 79.3 (L) 03/19/2023   PLT 281 03/19/2023   Lab Results  Component Value Date   FERRITIN 27 11/18/2022   IRON 49 11/18/2022   TIBC 396 11/18/2022   UIBC 347 11/18/2022   IRONPCTSAT 12 11/18/2022   Lab Results  Component Value Date   RETICCTPCT 1.5 03/19/2023   RBC 4.17 03/19/2023   No results found for: "KPAFRELGTCHN", "LAMBDASER", "KAPLAMBRATIO" No results found for: "IGGSERUM", "IGA", "IGMSERUM" No results found for: "TOTALPROTELP", "ALBUMINELP", "A1GS", "A2GS", "BETS", "BETA2SER", "GAMS", "MSPIKE", "SPEI"   Chemistry      Component Value Date/Time   NA 142 03/06/2023 1405   NA 142 09/19/2020 1129   K 4.1 03/06/2023 1405   CL 104 03/06/2023 1405   CO2 28 03/06/2023 1405   BUN 24 (H) 03/06/2023 1405   BUN 18 09/19/2020 1129   CREATININE 1.01 03/06/2023 1405   CREATININE 0.85 08/14/2022 1008   CREATININE 0.78 06/16/2015 1005      Component Value Date/Time   CALCIUM 9.2 03/06/2023 1405   ALKPHOS 99 03/06/2023 1405   AST 12 03/06/2023 1405   AST 12 (L) 08/14/2022 1008   ALT 8 03/06/2023 1405   ALT 8 08/14/2022 1008   BILITOT 0.4 03/06/2023 1405   BILITOT 0.3 08/14/2022 1008       Impression and Plan: Cathy Gallegos is a very pleasant 72 yo caucasian female with recent iron deficiency anemia.    We will go ahead and see what her iron levels look like.  I would have to believe that she is going to be low in iron.  We will probably have to get her in next week for IV iron.  She did well with Monoferric so would think that we could use Monoferric again.  Will plan to get her back little bit sooner than 3 to 4  months.  We probably will have to get her back in about 6 weeks.    Rose Phi  Myna Hidalgo, MD 6/26/202412:57 PM

## 2023-03-24 ENCOUNTER — Ambulatory Visit (INDEPENDENT_AMBULATORY_CARE_PROVIDER_SITE_OTHER): Payer: PPO | Admitting: Urology

## 2023-03-24 ENCOUNTER — Encounter: Payer: Self-pay | Admitting: Urology

## 2023-03-24 VITALS — BP 148/82 | HR 80 | Ht 65.0 in | Wt 179.0 lb

## 2023-03-24 DIAGNOSIS — N3941 Urge incontinence: Secondary | ICD-10-CM | POA: Diagnosis not present

## 2023-03-24 DIAGNOSIS — R8290 Unspecified abnormal findings in urine: Secondary | ICD-10-CM | POA: Diagnosis not present

## 2023-03-24 DIAGNOSIS — Z87440 Personal history of urinary (tract) infections: Secondary | ICD-10-CM | POA: Diagnosis not present

## 2023-03-24 LAB — BLADDER SCAN AMB NON-IMAGING

## 2023-03-24 MED ORDER — SULFAMETHOXAZOLE-TRIMETHOPRIM 800-160 MG PO TABS
1.0000 | ORAL_TABLET | Freq: Two times a day (BID) | ORAL | 0 refills | Status: DC
Start: 2023-03-24 — End: 2023-04-03

## 2023-03-24 MED ORDER — OXYBUTYNIN CHLORIDE ER 10 MG PO TB24
10.0000 mg | ORAL_TABLET | Freq: Every day | ORAL | 11 refills | Status: DC
Start: 2023-03-24 — End: 2023-05-06
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
## 2023-03-25 ENCOUNTER — Telehealth (HOSPITAL_BASED_OUTPATIENT_CLINIC_OR_DEPARTMENT_OTHER): Payer: Self-pay

## 2023-03-25 LAB — URINALYSIS, ROUTINE W REFLEX MICROSCOPIC
Bilirubin, UA: NEGATIVE
Glucose, UA: NEGATIVE
Ketones, UA: NEGATIVE
Nitrite, UA: POSITIVE — AB
Protein,UA: NEGATIVE
RBC, UA: NEGATIVE
Specific Gravity, UA: 1.03 (ref 1.005–1.030)
Urobilinogen, Ur: 0.2 mg/dL (ref 0.2–1.0)
pH, UA: 6 (ref 5.0–7.5)

## 2023-03-25 LAB — MICROSCOPIC EXAMINATION
Cast Type: NONE SEEN
Casts: NONE SEEN /lpf
Crystal Type: NONE SEEN
Crystals: NONE SEEN
RBC, Urine: NONE SEEN /hpf (ref 0–2)
Renal Epithel, UA: NONE SEEN /hpf
Trichomonas, UA: NONE SEEN
WBC, UA: >30 /hpf — AB (ref 0–5)
Yeast, UA: NONE SEEN

## 2023-03-28 LAB — URINE CULTURE

## 2023-04-02 ENCOUNTER — Other Ambulatory Visit: Payer: Self-pay | Admitting: Urology

## 2023-04-02 ENCOUNTER — Inpatient Hospital Stay: Payer: PPO

## 2023-04-02 ENCOUNTER — Inpatient Hospital Stay: Payer: PPO | Attending: Hematology & Oncology

## 2023-04-02 VITALS — BP 135/68 | HR 68 | Temp 98.0°F | Resp 18

## 2023-04-02 DIAGNOSIS — R5383 Other fatigue: Secondary | ICD-10-CM | POA: Insufficient documentation

## 2023-04-02 DIAGNOSIS — Z79899 Other long term (current) drug therapy: Secondary | ICD-10-CM | POA: Insufficient documentation

## 2023-04-02 DIAGNOSIS — R0602 Shortness of breath: Secondary | ICD-10-CM | POA: Insufficient documentation

## 2023-04-02 DIAGNOSIS — I739 Peripheral vascular disease, unspecified: Secondary | ICD-10-CM | POA: Diagnosis not present

## 2023-04-02 DIAGNOSIS — D509 Iron deficiency anemia, unspecified: Secondary | ICD-10-CM | POA: Diagnosis not present

## 2023-04-02 DIAGNOSIS — R8290 Unspecified abnormal findings in urine: Secondary | ICD-10-CM

## 2023-04-02 DIAGNOSIS — M791 Myalgia, unspecified site: Secondary | ICD-10-CM | POA: Insufficient documentation

## 2023-04-02 MED ORDER — SODIUM CHLORIDE 0.9 % IV SOLN: Freq: Once | INTRAVENOUS | Status: AC

## 2023-04-02 MED ORDER — SODIUM CHLORIDE 0.9 % IV SOLN
1000.0000 mg | Freq: Once | INTRAVENOUS | Status: AC
  Administered 2023-04-02: 1000 mg via INTRAVENOUS
  Filled 2023-04-02: qty 10

## 2023-04-02 NOTE — Patient Instructions (Signed)

## 2023-04-30 ENCOUNTER — Other Ambulatory Visit: Payer: Self-pay

## 2023-04-30 ENCOUNTER — Inpatient Hospital Stay: Payer: PPO | Admitting: Hematology & Oncology

## 2023-04-30 ENCOUNTER — Encounter: Payer: Self-pay | Admitting: Hematology & Oncology

## 2023-04-30 ENCOUNTER — Inpatient Hospital Stay: Payer: PPO | Attending: Hematology & Oncology

## 2023-04-30 VITALS — BP 128/76 | HR 63 | Temp 98.2°F | Resp 18 | Ht 65.0 in | Wt 178.8 lb

## 2023-04-30 DIAGNOSIS — D500 Iron deficiency anemia secondary to blood loss (chronic): Secondary | ICD-10-CM

## 2023-04-30 DIAGNOSIS — Z888 Allergy status to other drugs, medicaments and biological substances status: Secondary | ICD-10-CM | POA: Insufficient documentation

## 2023-04-30 DIAGNOSIS — I739 Peripheral vascular disease, unspecified: Secondary | ICD-10-CM | POA: Insufficient documentation

## 2023-04-30 DIAGNOSIS — Z79899 Other long term (current) drug therapy: Secondary | ICD-10-CM | POA: Insufficient documentation

## 2023-04-30 DIAGNOSIS — R0602 Shortness of breath: Secondary | ICD-10-CM | POA: Diagnosis not present

## 2023-04-30 DIAGNOSIS — M791 Myalgia, unspecified site: Secondary | ICD-10-CM | POA: Diagnosis not present

## 2023-04-30 DIAGNOSIS — R5383 Other fatigue: Secondary | ICD-10-CM | POA: Diagnosis not present

## 2023-04-30 DIAGNOSIS — Z1239 Encounter for other screening for malignant neoplasm of breast: Secondary | ICD-10-CM

## 2023-04-30 DIAGNOSIS — D509 Iron deficiency anemia, unspecified: Secondary | ICD-10-CM | POA: Diagnosis not present

## 2023-04-30 LAB — CBC WITH DIFFERENTIAL (CANCER CENTER ONLY)
Abs Immature Granulocytes: 0.12 10*3/uL — ABNORMAL HIGH (ref 0.00–0.07)
Basophils Absolute: 0 10*3/uL (ref 0.0–0.1)
Basophils Relative: 1 %
Eosinophils Absolute: 0.2 10*3/uL (ref 0.0–0.5)
Eosinophils Relative: 3 %
HCT: 36.3 % (ref 36.0–46.0)
Hemoglobin: 11.2 g/dL — ABNORMAL LOW (ref 12.0–15.0)
Immature Granulocytes: 2 %
Lymphocytes Relative: 24 %
Lymphs Abs: 1.9 10*3/uL (ref 0.7–4.0)
MCH: 25 pg — ABNORMAL LOW (ref 26.0–34.0)
MCHC: 30.9 g/dL (ref 30.0–36.0)
MCV: 81 fL (ref 80.0–100.0)
Monocytes Absolute: 0.4 10*3/uL (ref 0.1–1.0)
Monocytes Relative: 5 %
Neutro Abs: 5.3 10*3/uL (ref 1.7–7.7)
Neutrophils Relative %: 65 %
Platelet Count: 242 10*3/uL (ref 150–400)
RBC: 4.48 MIL/uL (ref 3.87–5.11)
RDW: 19.7 % — ABNORMAL HIGH (ref 11.5–15.5)
WBC Count: 8.1 10*3/uL (ref 4.0–10.5)
nRBC: 0 % (ref 0.0–0.2)

## 2023-04-30 LAB — CMP (CANCER CENTER ONLY)
ALT: 9 U/L (ref 0–44)
AST: 12 U/L — ABNORMAL LOW (ref 15–41)
Albumin: 3.9 g/dL (ref 3.5–5.0)
Alkaline Phosphatase: 92 U/L (ref 38–126)
Anion gap: 8 (ref 5–15)
BUN: 18 mg/dL (ref 8–23)
CO2: 28 mmol/L (ref 22–32)
Calcium: 8.8 mg/dL — ABNORMAL LOW (ref 8.9–10.3)
Chloride: 105 mmol/L (ref 98–111)
Creatinine: 1.04 mg/dL — ABNORMAL HIGH (ref 0.44–1.00)
GFR, Estimated: 57 mL/min — ABNORMAL LOW (ref >60)
Glucose, Bld: 169 mg/dL — ABNORMAL HIGH (ref 70–99)
Potassium: 4.4 mmol/L (ref 3.5–5.1)
Sodium: 141 mmol/L (ref 135–145)
Total Bilirubin: 0.3 mg/dL (ref 0.3–1.2)
Total Protein: 6.4 g/dL — ABNORMAL LOW (ref 6.5–8.1)

## 2023-04-30 LAB — FERRITIN: Ferritin: 173 ng/mL (ref 11–307)

## 2023-04-30 LAB — RETICULOCYTES
Immature Retic Fract: 9.9 % (ref 2.3–15.9)
RBC.: 4.53 MIL/uL (ref 3.87–5.11)
Retic Count, Absolute: 75.7 10*3/uL (ref 19.0–186.0)
Retic Ct Pct: 1.7 % (ref 0.4–3.1)

## 2023-04-30 NOTE — Progress Notes (Signed)
Hematology and Oncology Follow Up Visit  Cathy Gallegos [id number] 05-Jan-1951 72 y.o. 04/30/2023   Principle Diagnosis:  Iron deficiency anemia   Current Therapy:   IV iron as indicated  --Monoferric given on 107/06/2023   Interim History:  Cathy Gallegos is here today for follow-up.  We had to give her some iron back in July.  When we saw her at that time, her ferritin was 6 with iron saturation of 8%.  She felt better after the iron.  She had no problems with taking the iron.  She gets a lot of ferric.  She has had no issues with cough or shortness of breath.  She has had no obvious change in bowel or bladder habits.    Of note, her last colonoscopy was back in 2016.  She has some diverticulosis.  She has had no issues with rashes.  There is been no leg swelling.  Her appetite has been okay.  She has had no issues with nausea or vomiting.  There is no headache.  Overall, I would say that her performance status is probably ECOG 1.    Medications:  Allergies as of 04/30/2023       Reactions   Gabapentin Nausea Only   Other Reaction(s): GI Intolerance   Dust Mite Mixed Allergen Ext [mite (d. Farinae)]    Molds & Smuts    Latex Swelling, Other (See Comments)        Medication List        Accurate as of April 30, 2023  3:23 PM. If you have any questions, ask your nurse or doctor.          STOP taking these medications    sulfamethoxazole-trimethoprim 800-160 MG tablet Commonly known as: BACTRIM DS Stopped by: Josph Macho       TAKE these medications    EPINEPHrine 0.3 mg/0.3 mL Soaj injection Commonly known as: EpiPen 2-Pak Inject 0.3 mg into the muscle as needed for anaphylaxis.   ibuprofen 200 MG tablet Commonly known as: ADVIL Take 200 mg by mouth every 8 (eight) hours as needed.   oxybutynin 10 MG 24 hr tablet Commonly known as: DITROPAN-XL Take 1 tablet (10 mg total) by mouth daily.   PARoxetine 40 MG tablet Commonly known as: PAXIL Take 1  tablet (40 mg total) by mouth daily.        Allergies:  Allergies  Allergen Reactions   Gabapentin Nausea Only    Other Reaction(s): GI Intolerance   Dust Mite Mixed Allergen Ext [Mite (D. Farinae)]    Molds & Smuts    Latex Swelling and Other (See Comments)    Past Medical History, Surgical history, Social history, and Family History were reviewed and updated.  Review of Systems: Review of Systems  Constitutional:  Positive for malaise/fatigue.  HENT: Negative.    Eyes: Negative.   Respiratory:  Positive for shortness of breath.   Cardiovascular:  Positive for claudication.  Gastrointestinal: Negative.   Genitourinary: Negative.   Musculoskeletal:  Positive for myalgias.  Skin: Negative.   Neurological: Negative.   Endo/Heme/Allergies: Negative.   Psychiatric/Behavioral: Negative.       Physical Exam:  height is 5\' 5"  (1.651 m) and weight is 178 lb 12.8 oz (81.1 kg). Her oral temperature is 98.2 F (36.8 C). Her blood pressure is 128/76 and her pulse is 63. Her respiration is 18 and oxygen saturation is 100%.   Wt Readings from Last 3 Encounters:  04/30/23 178 lb 12.8 oz (  81.1 kg)  03/24/23 179 lb (81.2 kg)  03/19/23 179 lb 6.4 oz (81.4 kg)    Physical Exam Vitals reviewed.  HENT:     Head: Normocephalic and atraumatic.  Eyes:     Pupils: Pupils are equal, round, and reactive to light.  Cardiovascular:     Rate and Rhythm: Normal rate and regular rhythm.     Heart sounds: Normal heart sounds.  Pulmonary:     Effort: Pulmonary effort is normal.     Breath sounds: Normal breath sounds.  Abdominal:     General: Bowel sounds are normal.     Palpations: Abdomen is soft.  Musculoskeletal:        General: No tenderness or deformity. Normal range of motion.     Cervical back: Normal range of motion.  Lymphadenopathy:     Cervical: No cervical adenopathy.  Skin:    General: Skin is warm and dry.     Findings: No erythema or rash.  Neurological:     Mental  Status: She is alert and oriented to person, place, and time.  Psychiatric:        Behavior: Behavior normal.        Thought Content: Thought content normal.        Judgment: Judgment normal.      Lab Results  Component Value Date   WBC 8.1 04/30/2023   HGB 11.2 (L) 04/30/2023   HCT 36.3 04/30/2023   MCV 81.0 04/30/2023   PLT 242 04/30/2023   Lab Results  Component Value Date   FERRITIN 6 (L) 03/19/2023   IRON 36 03/19/2023   TIBC 470 (H) 03/19/2023   UIBC 434 03/19/2023   IRONPCTSAT 8 (L) 03/19/2023   Lab Results  Component Value Date   RETICCTPCT 1.7 04/30/2023   RBC 4.48 04/30/2023   RBC 4.53 04/30/2023   No results found for: "KPAFRELGTCHN", "LAMBDASER", "KAPLAMBRATIO" No results found for: "IGGSERUM", "IGA", "IGMSERUM" No results found for: "TOTALPROTELP", "ALBUMINELP", "A1GS", "A2GS", "BETS", "BETA2SER", "GAMS", "MSPIKE", "SPEI"   Chemistry      Component Value Date/Time   NA 141 04/30/2023 1441   NA 142 09/19/2020 1129   K 4.4 04/30/2023 1441   CL 105 04/30/2023 1441   CO2 28 04/30/2023 1441   BUN 18 04/30/2023 1441   BUN 18 09/19/2020 1129   CREATININE 1.04 (H) 04/30/2023 1441   CREATININE 0.78 06/16/2015 1005      Component Value Date/Time   CALCIUM 8.8 (L) 04/30/2023 1441   ALKPHOS 92 04/30/2023 1441   AST 12 (L) 04/30/2023 1441   ALT 9 04/30/2023 1441   BILITOT 0.3 04/30/2023 1441       Impression and Plan: Cathy Gallegos is a very pleasant 72 yo caucasian female with recent iron deficiency anemia.    We will go ahead and see what her iron levels look like.  Hopefully, her iron is low but better.  Her MCV is still on the low side.  When we saw her A month ago, she had incredibly low ferritin and iron saturation so it is possible that we may have to give her another dose of iron.  I will let have her come back to see [place] in 6 weeks for follow-up.  Hopefully, we can work her follow-up appointments around the Holiday season down the road.     Josph Macho, MD 8/7/20243:23 PM

## 2023-05-01 ENCOUNTER — Telehealth: Payer: Self-pay | Admitting: *Deleted

## 2023-05-01 NOTE — Telephone Encounter (Signed)
As noted below by Dr. Myna Hidalgo, I informed the patient that the iron studies look OK. She verbalized understanding.

## 2023-05-01 NOTE — Telephone Encounter (Signed)
-----   Message from Josph Macho sent at 05/01/2023 12:20 PM EDT ----- Please call and let her know that the iron studies look okay.  Thanks.  Cindee Lame

## 2023-05-06 ENCOUNTER — Ambulatory Visit: Payer: PPO | Admitting: Urology

## 2023-05-06 ENCOUNTER — Encounter: Payer: Self-pay | Admitting: Urology

## 2023-05-06 VITALS — BP 156/85 | HR 66 | Ht 65.0 in | Wt 179.0 lb

## 2023-05-06 DIAGNOSIS — R8290 Unspecified abnormal findings in urine: Secondary | ICD-10-CM

## 2023-05-06 DIAGNOSIS — N3941 Urge incontinence: Secondary | ICD-10-CM | POA: Diagnosis not present

## 2023-05-06 DIAGNOSIS — Z87440 Personal history of urinary (tract) infections: Secondary | ICD-10-CM

## 2023-05-06 MED ORDER — CEFDINIR 300 MG PO CAPS
300.0000 mg | ORAL_CAPSULE | Freq: Two times a day (BID) | ORAL | 0 refills | Status: AC
Start: 2023-05-06 — End: 2023-05-13

## 2023-05-06 MED ORDER — TROSPIUM CHLORIDE 20 MG PO TABS: 20.0000 mg | ORAL_TABLET | Freq: Two times a day (BID) | ORAL | 11 refills | Status: DC
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
## 2023-05-09 LAB — URINE CULTURE

## 2023-05-20 ENCOUNTER — Telehealth (HOSPITAL_BASED_OUTPATIENT_CLINIC_OR_DEPARTMENT_OTHER): Payer: Self-pay

## 2023-05-28 ENCOUNTER — Telehealth: Payer: Self-pay | Admitting: Family

## 2023-05-28 NOTE — Telephone Encounter (Signed)
Patient called and stated she did not feel well and wanted to be seen earlier. Consulted with Eileen Stanford and agreed on bringing her in for labs only and keeping follow up the same on 9/18. Attempted to call patient back x2 no answer and no vm box.

## 2023-06-04 ENCOUNTER — Inpatient Hospital Stay: Payer: PPO | Attending: Hematology & Oncology

## 2023-06-04 DIAGNOSIS — Z888 Allergy status to other drugs, medicaments and biological substances status: Secondary | ICD-10-CM | POA: Diagnosis not present

## 2023-06-04 DIAGNOSIS — D509 Iron deficiency anemia, unspecified: Secondary | ICD-10-CM | POA: Diagnosis not present

## 2023-06-04 DIAGNOSIS — D500 Iron deficiency anemia secondary to blood loss (chronic): Secondary | ICD-10-CM

## 2023-06-04 DIAGNOSIS — M25512 Pain in left shoulder: Secondary | ICD-10-CM | POA: Diagnosis not present

## 2023-06-04 DIAGNOSIS — M79602 Pain in left arm: Secondary | ICD-10-CM | POA: Diagnosis not present

## 2023-06-04 DIAGNOSIS — Z79899 Other long term (current) drug therapy: Secondary | ICD-10-CM | POA: Diagnosis not present

## 2023-06-04 DIAGNOSIS — R5383 Other fatigue: Secondary | ICD-10-CM | POA: Insufficient documentation

## 2023-06-04 DIAGNOSIS — R42 Dizziness and giddiness: Secondary | ICD-10-CM | POA: Diagnosis not present

## 2023-06-04 DIAGNOSIS — R0602 Shortness of breath: Secondary | ICD-10-CM | POA: Insufficient documentation

## 2023-06-04 LAB — CMP (CANCER CENTER ONLY)
ALT: 10 U/L (ref 0–44)
AST: 12 U/L — ABNORMAL LOW (ref 15–41)
Albumin: 4.1 g/dL (ref 3.5–5.0)
Alkaline Phosphatase: 99 U/L (ref 38–126)
Anion gap: 8 (ref 5–15)
BUN: 16 mg/dL (ref 8–23)
CO2: 27 mmol/L (ref 22–32)
Calcium: 9.3 mg/dL (ref 8.9–10.3)
Chloride: 107 mmol/L (ref 98–111)
Creatinine: 0.85 mg/dL (ref 0.44–1.00)
GFR, Estimated: >60 mL/min (ref >60)
Glucose, Bld: 113 mg/dL — ABNORMAL HIGH (ref 70–99)
Potassium: 3.5 mmol/L (ref 3.5–5.1)
Sodium: 142 mmol/L (ref 135–145)
Total Bilirubin: 0.5 mg/dL (ref 0.3–1.2)
Total Protein: 6.5 g/dL (ref 6.5–8.1)

## 2023-06-04 LAB — IRON AND IRON BINDING CAPACITY (CC-WL,HP ONLY)
Iron: 79 ug/dL (ref 28–170)
Saturation Ratios: 25 % (ref 10.4–31.8)
TIBC: 311 ug/dL (ref 250–450)
UIBC: 232 ug/dL (ref 148–442)

## 2023-06-04 LAB — CBC WITH DIFFERENTIAL (CANCER CENTER ONLY)
Abs Immature Granulocytes: 0.02 10*3/uL (ref 0.00–0.07)
Basophils Absolute: 0 10*3/uL (ref 0.0–0.1)
Basophils Relative: 0 %
Eosinophils Absolute: 0.2 10*3/uL (ref 0.0–0.5)
Eosinophils Relative: 3 %
HCT: 39.5 % (ref 36.0–46.0)
Hemoglobin: 12.5 g/dL (ref 12.0–15.0)
Immature Granulocytes: 0 %
Lymphocytes Relative: 27 %
Lymphs Abs: 2.2 10*3/uL (ref 0.7–4.0)
MCH: 25.4 pg — ABNORMAL LOW (ref 26.0–34.0)
MCHC: 31.6 g/dL (ref 30.0–36.0)
MCV: 80.3 fL (ref 80.0–100.0)
Monocytes Absolute: 0.4 10*3/uL (ref 0.1–1.0)
Monocytes Relative: 5 %
Neutro Abs: 5.2 10*3/uL (ref 1.7–7.7)
Neutrophils Relative %: 65 %
Platelet Count: 248 10*3/uL (ref 150–400)
RBC: 4.92 MIL/uL (ref 3.87–5.11)
RDW: 19 % — ABNORMAL HIGH (ref 11.5–15.5)
WBC Count: 8.1 10*3/uL (ref 4.0–10.5)
nRBC: 0 % (ref 0.0–0.2)

## 2023-06-04 LAB — FERRITIN: Ferritin: 107 ng/mL (ref 11–307)

## 2023-06-06 ENCOUNTER — Telehealth: Payer: Self-pay

## 2023-06-06 NOTE — Telephone Encounter (Signed)
Called patient to inform her that her iron studies look ok per Dr. Myna Hidalgo. Patient expressed gratitude for the call.

## 2023-06-06 NOTE — Telephone Encounter (Signed)
-----   Message from Josph Macho sent at 06/05/2023  4:52 PM EDT ----- Please call and let her know that the iron studies look okay.  Thanks.  Cindee Lame

## 2023-06-11 ENCOUNTER — Inpatient Hospital Stay: Payer: PPO | Admitting: Family

## 2023-06-11 ENCOUNTER — Inpatient Hospital Stay: Payer: PPO

## 2023-06-11 ENCOUNTER — Encounter: Payer: Self-pay | Admitting: Family

## 2023-06-11 VITALS — BP 160/79 | HR 81 | Temp 98.2°F | Resp 18 | Wt 178.1 lb

## 2023-06-11 DIAGNOSIS — D500 Iron deficiency anemia secondary to blood loss (chronic): Secondary | ICD-10-CM

## 2023-06-11 DIAGNOSIS — D509 Iron deficiency anemia, unspecified: Secondary | ICD-10-CM | POA: Diagnosis not present

## 2023-06-11 NOTE — Progress Notes (Signed)
Pt expressed concerns about being out of money. Said she runs out on the 18th of every month. Advised her that our social worker, Larita Fife was in the office and could speak with her and assist and pt declined at this time.

## 2023-06-11 NOTE — Progress Notes (Signed)
Hematology and Oncology Follow Up Visit  Cathy Gallegos [id number] 25-Oct-1950 72 y.o. 06/11/2023   Principle Diagnosis:  Iron deficiency anemia    Current Therapy:        IV iron as indicated    Interim History:  Ms. Cathy Gallegos is here today for follow-up. She still notes fatigue at times. She feels lonely as all of her friends and family have passed on. She does not feel that she needs any help at this time from a Child psychotherapist.  She states that she enjoys getting out daily. I encouraged her to walk outside now that the weather is nice and try to find some activity groups locally to get involved in. She will let Cathy Gallegos know if she decides she needs any help.  No fever, chills, n/v, cough, rash, palpitations, abdominal pain or changes in bowel or bladder habits.  She has pain in the left shoulder and arm since a prior surgery that waxes and wanes.  Occasional dizziness and SOB with over exertion.  No falls or syncope reported.  No swelling in her extremities.  She has not noted any obvious blood loss. No bruising or petechiae.  Appetite and hydration are good. Weight is stable at 178 lbs.   ECOG Performance Status: 1 - Symptomatic but completely ambulatory  Medications:  Allergies as of 06/11/2023       Reactions   Gabapentin Nausea Only   Other Reaction(s): GI Intolerance   Dust Mite Mixed Allergen Ext [mite (d. Farinae)]    Molds & Smuts    Latex Swelling, Other (See Comments)        Medication List        Accurate as of June 11, 2023  2:23 PM. If you have any questions, ask your nurse or doctor.          EPINEPHrine 0.3 mg/0.3 mL Soaj injection Commonly known as: EpiPen 2-Pak Inject 0.3 mg into the muscle as needed for anaphylaxis.   ibuprofen 200 MG tablet Commonly known as: ADVIL Take 200 mg by mouth every 8 (eight) hours as needed.   oxybutynin 10 MG 24 hr tablet Commonly known as: DITROPAN-XL Take 10 mg by mouth daily.   PARoxetine 40 MG tablet Commonly  known as: PAXIL Take 1 tablet (40 mg total) by mouth daily.   trospium 20 MG tablet Commonly known as: SANCTURA Take 1 tablet (20 mg total) by mouth 2 (two) times daily.        Allergies:  Allergies  Allergen Reactions   Gabapentin Nausea Only    Other Reaction(s): GI Intolerance   Dust Mite Mixed Allergen Ext [Mite (D. Farinae)]    Molds & Smuts    Latex Swelling and Other (See Comments)    Past Medical History, Surgical history, Social history, and Family History were reviewed and updated.  Review of Systems: All other 10 point review of systems is negative.   Physical Exam:  weight is 178 lb 1.3 oz (80.8 kg). Her oral temperature is 98.2 F (36.8 C). Her blood pressure is 160/79 (abnormal) and her pulse is 81. Her respiration is 18 and oxygen saturation is 96%.   Wt Readings from Last 3 Encounters:  06/11/23 178 lb 1.3 oz (80.8 kg)  05/06/23 179 lb (81.2 kg)  04/30/23 178 lb 12.8 oz (81.1 kg)    Ocular: Sclerae unicteric, pupils equal, round and reactive to light Ear-nose-throat: Oropharynx clear, dentition fair Lymphatic: No cervical or supraclavicular adenopathy Lungs no rales or rhonchi, good excursion  bilaterally Heart regular rate and rhythm, no murmur appreciated Abd soft, nontender, positive bowel sounds MSK no focal spinal tenderness, no joint edema Neuro: non-focal, well-oriented, appropriate affect Breasts: Deferred   Lab Results  Component Value Date   WBC 8.1 06/04/2023   HGB 12.5 06/04/2023   HCT 39.5 06/04/2023   MCV 80.3 06/04/2023   PLT 248 06/04/2023   Lab Results  Component Value Date   FERRITIN 107 06/04/2023   IRON 79 06/04/2023   TIBC 311 06/04/2023   UIBC 232 06/04/2023   IRONPCTSAT 25 06/04/2023   Lab Results  Component Value Date   RETICCTPCT 1.7 04/30/2023   RBC 4.92 06/04/2023   No results found for: "KPAFRELGTCHN", "LAMBDASER", "KAPLAMBRATIO" No results found for: "IGGSERUM", "IGA", "IGMSERUM" No results found for:  "TOTALPROTELP", "ALBUMINELP", "A1GS", "A2GS", "BETS", "BETA2SER", "GAMS", "MSPIKE", "SPEI"   Chemistry      Component Value Date/Time   NA 142 06/04/2023 1122   NA 142 09/19/2020 1129   K 3.5 06/04/2023 1122   CL 107 06/04/2023 1122   CO2 27 06/04/2023 1122   BUN 16 06/04/2023 1122   BUN 18 09/19/2020 1129   CREATININE 0.85 06/04/2023 1122   CREATININE 0.78 06/16/2015 1005      Component Value Date/Time   CALCIUM 9.3 06/04/2023 1122   ALKPHOS 99 06/04/2023 1122   AST 12 (L) 06/04/2023 1122   ALT 10 06/04/2023 1122   BILITOT 0.5 06/04/2023 1122       Impression and Plan: Cathy Gallegos is a very pleasant 72 yo caucasian female with iron deficiency anemia.   She has responded nicely to the Monoferric she received in July.  No IV iron needed at this time.  Follow-up in 4 months.   Eileen Stanford, NP 9/18/20242:23 PM

## 2023-06-18 ENCOUNTER — Encounter: Payer: Self-pay | Admitting: Urology

## 2023-06-18 ENCOUNTER — Ambulatory Visit (INDEPENDENT_AMBULATORY_CARE_PROVIDER_SITE_OTHER): Payer: PPO | Admitting: Urology

## 2023-06-18 VITALS — BP 184/93 | HR 80 | Ht 65.0 in | Wt 177.0 lb

## 2023-06-18 DIAGNOSIS — N3941 Urge incontinence: Secondary | ICD-10-CM

## 2023-06-18 DIAGNOSIS — Z87440 Personal history of urinary (tract) infections: Secondary | ICD-10-CM

## 2023-06-18 DIAGNOSIS — R8290 Unspecified abnormal findings in urine: Secondary | ICD-10-CM

## 2023-06-18 MED ORDER — TROSPIUM CHLORIDE 20 MG PO TABS
20.0000 mg | ORAL_TABLET | Freq: Two times a day (BID) | ORAL | 11 refills | Status: DC
Start: 2023-06-18 — End: 2023-07-08
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
## 2023-06-21 LAB — URINE CULTURE

## 2023-06-23 ENCOUNTER — Telehealth: Payer: Self-pay | Admitting: Urology

## 2023-06-23 LAB — URINALYSIS, ROUTINE W REFLEX MICROSCOPIC
Bilirubin, UA: NEGATIVE
Glucose, UA: NEGATIVE
Ketones, UA: NEGATIVE
Leukocytes,UA: NEGATIVE
Nitrite, UA: NEGATIVE
Protein,UA: NEGATIVE
Specific Gravity, UA: 1.01 (ref 1.005–1.030)
Urobilinogen, Ur: 0.2 mg/dL (ref 0.2–1.0)
pH, UA: 6 (ref 5.0–7.5)

## 2023-06-23 LAB — MICROSCOPIC EXAMINATION

## 2023-06-23 MED ORDER — NITROFURANTOIN MONOHYD MACRO 100 MG PO CAPS
100.0000 mg | ORAL_CAPSULE | Freq: Two times a day (BID) | ORAL | 0 refills | Status: AC
Start: 2023-06-23 — End: 2023-07-03

## 2023-06-23 NOTE — Telephone Encounter (Signed)
Spoke w pt earlier regarding results.

## 2023-06-23 NOTE — Addendum Note (Signed)
Addended by: Milderd Meager on: 06/23/2023 08:47 AM   Modules accepted: Orders

## 2023-06-23 NOTE — Telephone Encounter (Signed)
Someone called her this morning. Returning our call.

## 2023-07-08 ENCOUNTER — Telehealth: Payer: Self-pay | Admitting: Urology

## 2023-07-08 ENCOUNTER — Other Ambulatory Visit: Payer: Self-pay | Admitting: Urology

## 2023-07-08 DIAGNOSIS — N3941 Urge incontinence: Secondary | ICD-10-CM

## 2023-07-08 MED ORDER — TROSPIUM CHLORIDE 20 MG PO TABS
20.0000 mg | ORAL_TABLET | Freq: Two times a day (BID) | ORAL | 11 refills | Status: DC
Start: 2023-07-08 — End: 2023-07-08

## 2023-07-08 MED ORDER — TROSPIUM CHLORIDE 20 MG PO TABS
20.0000 mg | ORAL_TABLET | Freq: Two times a day (BID) | ORAL | 11 refills | Status: DC
Start: 2023-07-08 — End: 2023-09-26

## 2023-07-08 NOTE — Telephone Encounter (Signed)
Pt misplaced her script and coupons. Wants to know if she can have replacement. Said she would come pick them up tomorrow.

## 2023-07-09 NOTE — Telephone Encounter (Signed)
Tried to call pt to let her know that her Rx and coupon are ready for pickup, VM full, unable to leave a message.

## 2023-07-14 ENCOUNTER — Other Ambulatory Visit: Payer: Self-pay

## 2023-07-14 ENCOUNTER — Telehealth: Payer: Self-pay | Admitting: Family Medicine

## 2023-07-14 MED ORDER — PAROXETINE HCL 40 MG PO TABS: 40.0000 mg | ORAL_TABLET | Freq: Every day | ORAL | 3 refills | Status: DC

## 2023-07-14 NOTE — Telephone Encounter (Signed)
Pt said it has been 3 weeks since she had her Paroxetine medication. Pt said she lost the bottle and now she is sick to her stomach and feeling dizzy. Pt wants to know if she can get an emergency refill until she finds her or if there is something else she can do. Pt has been trying to find the bottle with no luck. Please send to [facility] on [address].

## 2023-07-14 NOTE — Telephone Encounter (Signed)
Refill has been sent.  °

## 2023-07-23 ENCOUNTER — Encounter: Payer: Self-pay | Admitting: Urology

## 2023-07-23 ENCOUNTER — Ambulatory Visit (INDEPENDENT_AMBULATORY_CARE_PROVIDER_SITE_OTHER): Payer: PPO | Admitting: Urology

## 2023-07-23 VITALS — BP 169/90 | HR 89 | Ht 65.0 in | Wt 179.0 lb

## 2023-07-23 DIAGNOSIS — Z87440 Personal history of urinary (tract) infections: Secondary | ICD-10-CM

## 2023-07-23 DIAGNOSIS — N3941 Urge incontinence: Secondary | ICD-10-CM | POA: Diagnosis not present

## 2023-07-23 DIAGNOSIS — R8290 Unspecified abnormal findings in urine: Secondary | ICD-10-CM

## 2023-07-23 LAB — URINALYSIS, ROUTINE W REFLEX MICROSCOPIC
Bilirubin, UA: NEGATIVE
Glucose, UA: NEGATIVE
Ketones, UA: NEGATIVE
Nitrite, UA: POSITIVE — AB
Protein,UA: NEGATIVE
Specific Gravity, UA: 1.025 (ref 1.005–1.030)
Urobilinogen, Ur: 0.2 mg/dL (ref 0.2–1.0)
pH, UA: 6 (ref 5.0–7.5)

## 2023-07-23 LAB — MICROSCOPIC EXAMINATION: WBC, UA: >30 /[HPF] — ABNORMAL HIGH (ref 0–5)
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
## 2023-07-28 ENCOUNTER — Other Ambulatory Visit: Payer: Self-pay | Admitting: Urology

## 2023-07-28 ENCOUNTER — Telehealth: Payer: Self-pay

## 2023-07-28 DIAGNOSIS — R8290 Unspecified abnormal findings in urine: Secondary | ICD-10-CM

## 2023-07-28 MED ORDER — SULFAMETHOXAZOLE-TRIMETHOPRIM 800-160 MG PO TABS: 1.0000 | ORAL_TABLET | Freq: Two times a day (BID) | ORAL | 0 refills | Status: AC

## 2023-07-28 MED ORDER — NITROFURANTOIN MONOHYD MACRO 100 MG PO CAPS: 100.0000 mg | ORAL_CAPSULE | Freq: Every day | ORAL | 2 refills | Status: DC

## 2023-07-28 NOTE — Telephone Encounter (Signed)
-----   Message from Di Kindle sent at 07/28/2023  1:18 PM EST ----- Please notify patient to begin Bactrim DS twice daily for 1 week for treatment of UTI.  She should then begin daily Macrobid for UTI prevention. Keep follow-up for 6 weeks as scheduled.

## 2023-07-28 NOTE — Telephone Encounter (Signed)
Pt aware of the results and the need for antibiotic therapy. She verbalized understanding about finishing bactrim and then starting daily macrobid to prevent future infections. Pt already has an appt in December for f/u.

## 2023-07-29 LAB — URINE CULTURE

## 2023-09-03 ENCOUNTER — Ambulatory Visit: Payer: PPO | Admitting: Urology

## 2023-09-03 ENCOUNTER — Telehealth: Payer: Self-pay | Admitting: Family Medicine

## 2023-09-03 NOTE — Telephone Encounter (Signed)
Copied from CRM [id number]. Topic: Medicare AWV >> Sep 03, 2023  9:28 AM Payton Doughty wrote: Reason for CRM: Called 09/02/2023 to sched Annual Wellness Visit - NO VOICEMAIL  Verlee Rossetti; Care Guide Ambulatory Clinical Support [place] [facility] Direct Dial: [phone]

## 2023-09-08 NOTE — Patient Instructions (Incomplete)
To see you again today Recommend you get the shingles vaccine series called Shingrix and COVID booster if not yet done at your pharmacy
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
## 2023-09-10 ENCOUNTER — Ambulatory Visit: Payer: PPO | Admitting: Family Medicine

## 2023-09-10 VITALS — BP 120/72 | HR 82 | Temp 97.8°F | Resp 18 | Ht 65.0 in | Wt 177.8 lb

## 2023-09-10 DIAGNOSIS — J069 Acute upper respiratory infection, unspecified: Secondary | ICD-10-CM | POA: Diagnosis not present

## 2023-09-10 DIAGNOSIS — E2839 Other primary ovarian failure: Secondary | ICD-10-CM

## 2023-09-10 DIAGNOSIS — Z1231 Encounter for screening mammogram for malignant neoplasm of breast: Secondary | ICD-10-CM

## 2023-09-10 MED ORDER — DOXYCYCLINE HYCLATE 100 MG PO CAPS: 100.0000 mg | ORAL_CAPSULE | Freq: Two times a day (BID) | ORAL | 0 refills | Status: DC

## 2023-09-12 ENCOUNTER — Telehealth (HOSPITAL_BASED_OUTPATIENT_CLINIC_OR_DEPARTMENT_OTHER): Payer: Self-pay | Admitting: Family Medicine

## 2023-09-26 ENCOUNTER — Ambulatory Visit (INDEPENDENT_AMBULATORY_CARE_PROVIDER_SITE_OTHER): Payer: PPO | Admitting: Urology

## 2023-09-26 ENCOUNTER — Encounter: Payer: Self-pay | Admitting: Urology

## 2023-09-26 VITALS — BP 138/89 | HR 80 | Ht 65.0 in | Wt 179.0 lb

## 2023-09-26 DIAGNOSIS — N3941 Urge incontinence: Secondary | ICD-10-CM

## 2023-09-26 DIAGNOSIS — Z87440 Personal history of urinary (tract) infections: Secondary | ICD-10-CM | POA: Diagnosis not present

## 2023-09-26 LAB — URINALYSIS, ROUTINE W REFLEX MICROSCOPIC
Bilirubin, UA: NEGATIVE
Glucose, UA: NEGATIVE
Ketones, UA: NEGATIVE
Leukocytes,UA: NEGATIVE
Nitrite, UA: NEGATIVE
Protein,UA: NEGATIVE
RBC, UA: NEGATIVE
Specific Gravity, UA: 1.02 (ref 1.005–1.030)
Urobilinogen, Ur: 0.2 mg/dL (ref 0.2–1.0)
pH, UA: 6 (ref 5.0–7.5)

## 2023-09-26 MED ORDER — MIRABEGRON ER 50 MG PO TB24: 50.0000 mg | ORAL_TABLET | Freq: Every day | ORAL | 11 refills | Status: DC
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
## 2023-10-08 ENCOUNTER — Inpatient Hospital Stay: Payer: PPO | Admitting: Family

## 2023-10-08 ENCOUNTER — Inpatient Hospital Stay: Payer: PPO | Attending: Hematology & Oncology

## 2023-10-13 ENCOUNTER — Other Ambulatory Visit (HOSPITAL_BASED_OUTPATIENT_CLINIC_OR_DEPARTMENT_OTHER): Payer: PPO

## 2023-10-13 ENCOUNTER — Inpatient Hospital Stay (HOSPITAL_BASED_OUTPATIENT_CLINIC_OR_DEPARTMENT_OTHER): Admission: RE | Admit: 2023-10-13 | Payer: PPO | Source: Ambulatory Visit

## 2023-10-27 ENCOUNTER — Ambulatory Visit (HOSPITAL_BASED_OUTPATIENT_CLINIC_OR_DEPARTMENT_OTHER)
Admission: RE | Admit: 2023-10-27 | Discharge: 2023-10-27 | Disposition: A | Discharge status: Home / Self Care | Payer: PPO | Source: Ambulatory Visit | Attending: Hematology & Oncology | Admitting: Hematology & Oncology

## 2023-10-27 ENCOUNTER — Encounter (HOSPITAL_BASED_OUTPATIENT_CLINIC_OR_DEPARTMENT_OTHER): Payer: Self-pay

## 2023-10-27 ENCOUNTER — Ambulatory Visit (HOSPITAL_BASED_OUTPATIENT_CLINIC_OR_DEPARTMENT_OTHER)
Admission: RE | Admit: 2023-10-27 | Discharge: 2023-10-27 | Disposition: A | Discharge status: Home / Self Care | Payer: PPO | Source: Ambulatory Visit | Attending: Family Medicine | Admitting: Family Medicine

## 2023-10-27 DIAGNOSIS — Z1239 Encounter for other screening for malignant neoplasm of breast: Secondary | ICD-10-CM | POA: Insufficient documentation

## 2023-10-27 DIAGNOSIS — Z780 Asymptomatic menopausal state: Secondary | ICD-10-CM | POA: Diagnosis not present

## 2023-10-27 DIAGNOSIS — E2839 Other primary ovarian failure: Secondary | ICD-10-CM | POA: Insufficient documentation

## 2023-10-27 DIAGNOSIS — Z1231 Encounter for screening mammogram for malignant neoplasm of breast: Secondary | ICD-10-CM | POA: Insufficient documentation

## 2023-10-27 DIAGNOSIS — M810 Age-related osteoporosis without current pathological fracture: Secondary | ICD-10-CM | POA: Insufficient documentation

## 2023-10-28 ENCOUNTER — Encounter: Payer: Self-pay | Admitting: Family Medicine

## 2023-11-06 ENCOUNTER — Ambulatory Visit: Payer: Self-pay | Admitting: Family Medicine

## 2023-11-06 NOTE — Telephone Encounter (Signed)
  Chief Complaint: dizzy Symptoms: dizzy  Frequency: constant  Disposition: [] ED /[] Urgent Care (no appt availability in office) / [x] Appointment(In office/virtual)/ []  [facility]/ [] Home Care/ [] Refused Recommended Disposition /[] [facility]/ []  Follow-up with PCP Additional Notes: Pt calling with dizziness. Pt states it started feeling this way since yesterday afternoon. Pt states she started the medication   Mirabegron last week even though ordered in early January. Pt is unsure if this is the cause. Pt states she feels weak and has diarrhea. Pt lives alone. Per protocol, pt to be seen within 24 hours. Pt has appt 2/14 @1520 . RN gave care advice and pt verbalized understanding.               Copied from CRM [id number]. Topic: Clinical - Red Word Triage >> Nov 06, 2023  4:54 PM Cathy Gallegos wrote: Red Word that prompted transfer to Nurse Triage: dizziness and Blurred Vision, scale 9/10. Reason for Disposition  [1] MODERATE dizziness (e.g., vertigo; feels very unsteady, interferes with normal activities) AND [2] has NOT been evaluated by doctor (or NP/PA) for this  Answer Assessment - Initial Assessment Questions 1. DESCRIPTION: "Describe your dizziness."     Eyes "darting/spazzy" 2. VERTIGO: "Do you feel like either you or the room is spinning or tilting?"      Room spinning  3. LIGHTHEADED: "Do you feel lightheaded?" (e.g., somewhat faint, woozy, weak upon standing)     Weak sitting and standing 4. SEVERITY: "How bad is it?"  "Can you walk?"   - MILD: Feels slightly dizzy and unsteady, but is walking normally.   - MODERATE: Feels unsteady when walking, but not falling; interferes with normal activities (e.g., school, work).   - SEVERE: Unable to walk without falling, or requires assistance to walk without falling.     Moderate  5. ONSET:  "When did the dizziness begin?"     Since yesterday afternoon  6. AGGRAVATING FACTORS: "Does anything make it worse?"  (e.g., standing, change in head position)     " No" 7. CAUSE: "What do you think is causing the dizziness?"     unsure 8. RECURRENT SYMPTOM: "Have you had dizziness before?" If Yes, ask: "When was the last time?" "What happened that time?"     no 9. OTHER SYMPTOMS: "Do you have any other symptoms?" (e.g., headache, weakness, numbness, vomiting, earache)     Diarrhea, weakness  Protocols used: Dizziness - Vertigo-A-AH

## 2023-11-07 ENCOUNTER — Encounter: Payer: Self-pay | Admitting: Family

## 2023-11-07 ENCOUNTER — Encounter: Payer: Self-pay | Admitting: Physician Assistant

## 2023-11-07 ENCOUNTER — Ambulatory Visit: Payer: PPO | Admitting: Physician Assistant

## 2023-11-07 VITALS — BP 136/82 | HR 87 | Temp 97.7°F | Resp 18 | Ht 64.0 in | Wt 179.0 lb

## 2023-11-07 DIAGNOSIS — R42 Dizziness and giddiness: Secondary | ICD-10-CM

## 2023-11-07 MED ORDER — MECLIZINE HCL 12.5 MG PO TABS: 12.5000 mg | ORAL_TABLET | Freq: Three times a day (TID) | ORAL | 0 refills | Status: DC | PRN

## 2023-11-07 NOTE — Progress Notes (Signed)
Established patient visit   Patient: Cathy Gallegos   DOB: [birth year]   73 y.o. Female  MRN: [id number] Visit Date: 11/07/2023  Today's healthcare provider: Alfredia Ferguson, PA-C   Cc. dizziness  Subjective     Pt reports dizziness x 3 days, history of vertigo. She reports she recently started mrybetriq x 2 weeks ago, was taking at night-- but recently switched to taking it mid-day. She denies nasal congestion, ear pain, chest pain. Denies focal weakness, numbness, tingling. Denies change in speech.   Medications: Outpatient Medications Prior to Visit  Medication Sig   EPINEPHrine (EPIPEN 2-PAK) 0.3 mg/0.3 mL IJ SOAJ injection Inject 0.3 mg into the muscle as needed for anaphylaxis.   ibuprofen (ADVIL) 200 MG tablet Take 200 mg by mouth every 8 (eight) hours as needed.   mirabegron ER (MYRBETRIQ) 50 MG TB24 tablet Take 1 tablet (50 mg total) by mouth daily.   PARoxetine (PAXIL) 40 MG tablet Take 1 tablet (40 mg total) by mouth daily.   No facility-administered medications prior to visit.    Review of Systems  Constitutional:  Negative for fatigue and fever.  Respiratory:  Negative for cough and shortness of breath.   Cardiovascular:  Negative for chest pain and leg swelling.  Gastrointestinal:  Negative for abdominal pain.  Neurological:  Positive for dizziness. Negative for headaches.       Objective    BP 136/82 (BP Location: Right Leg, Patient Position: Sitting, Cuff Size: Large)   Pulse 87   Temp 97.7 F (36.5 C) (Oral)   Resp 18   Ht 5\' 4"  (1.626 m)   Wt 179 lb (81.2 kg)   SpO2 100%   BMI 30.73 kg/m    Physical Exam Constitutional:      General: She is awake.     Appearance: She is well-developed.  HENT:     Head: Normocephalic.     Right Ear: Tympanic membrane normal.     Left Ear: Tympanic membrane normal.  Eyes:     Conjunctiva/sclera: Conjunctivae normal.  Cardiovascular:     Rate and Rhythm: Normal rate and regular rhythm.     Heart sounds:  Normal heart sounds.  Pulmonary:     Effort: Pulmonary effort is normal.  Skin:    General: Skin is warm.  Neurological:     General: No focal deficit present.     Mental Status: She is alert and oriented to person, place, and time.  Psychiatric:        Attention and Perception: Attention normal.        Mood and Affect: Mood normal.        Speech: Speech normal.        Behavior: Behavior is cooperative.     No results found for any visits on 11/07/23.  Assessment & Plan    Dizziness -     Meclizine HCl; Take 1 tablet (12.5 mg total) by mouth 3 (three) times daily as needed for dizziness.  Dispense: 30 tablet; Refill: 0   Vitals stable, pt appears unsteady but otherwise exam grossly normal.   Recommending switching myrbetriq back to evening x 2-3 days, see if this changes her dizziness. If dizziness persists, d/c myrbetriq.  If dizziness persists after complete d/c-- recommending referral to vestibular PT  Rx meclizine for dizziness.  Return if symptoms worsen or fail to improve.       Alfredia Ferguson, PA-C  [facility]  [phone] (phone) [phone] (fax)  [facility]

## 2023-11-11 ENCOUNTER — Encounter: Payer: Self-pay | Admitting: Family Medicine

## 2023-11-13 ENCOUNTER — Ambulatory Visit: Payer: PPO | Admitting: Urology

## 2023-11-13 ENCOUNTER — Encounter: Payer: Self-pay | Admitting: Urology

## 2023-11-13 VITALS — BP 136/82 | HR 88

## 2023-11-13 DIAGNOSIS — Z87440 Personal history of urinary (tract) infections: Secondary | ICD-10-CM

## 2023-11-13 DIAGNOSIS — R8290 Unspecified abnormal findings in urine: Secondary | ICD-10-CM

## 2023-11-13 DIAGNOSIS — N3941 Urge incontinence: Secondary | ICD-10-CM | POA: Diagnosis not present

## 2023-11-13 LAB — MICROSCOPIC EXAMINATION: WBC, UA: >30 /[HPF] — ABNORMAL HIGH (ref 0–5)

## 2023-11-13 LAB — URINALYSIS, ROUTINE W REFLEX MICROSCOPIC
Bilirubin, UA: NEGATIVE
Glucose, UA: NEGATIVE
Ketones, UA: NEGATIVE
Nitrite, UA: NEGATIVE
Protein,UA: NEGATIVE
Specific Gravity, UA: 1.025 (ref 1.005–1.030)
Urobilinogen, Ur: 0.2 mg/dL (ref 0.2–1.0)
pH, UA: 5.5 (ref 5.0–7.5)
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
## 2023-11-16 LAB — URINE CULTURE

## 2023-11-17 MED ORDER — CEFDINIR 300 MG PO CAPS: 300.0000 mg | ORAL_CAPSULE | Freq: Two times a day (BID) | ORAL | 0 refills | Status: AC

## 2023-11-17 NOTE — Addendum Note (Signed)
 Addended by: Milderd Meager on: 11/17/2023 08:06 AM   Modules accepted: Orders

## 2023-11-25 NOTE — Patient Instructions (Addendum)
 It was good to see you today. If not done already recommend shingles vaccine series and a dose of RSV at your pharmacy.  Also recommend COVID booster if none the last 6 months or so  We will have you try Boniva for your bone density- let me know if you have any bothersome side effects  Otherwise we can plan to recheck your bone density in about 2 years
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
## 2023-12-01 ENCOUNTER — Ambulatory Visit (INDEPENDENT_AMBULATORY_CARE_PROVIDER_SITE_OTHER): Admitting: Family Medicine

## 2023-12-01 VITALS — BP 118/82 | HR 81 | Temp 98.1°F | Resp 18 | Ht 64.0 in | Wt 178.2 lb

## 2023-12-01 DIAGNOSIS — E2839 Other primary ovarian failure: Secondary | ICD-10-CM

## 2023-12-01 DIAGNOSIS — M810 Age-related osteoporosis without current pathological fracture: Secondary | ICD-10-CM

## 2023-12-01 DIAGNOSIS — D509 Iron deficiency anemia, unspecified: Secondary | ICD-10-CM

## 2023-12-01 DIAGNOSIS — Z5181 Encounter for therapeutic drug level monitoring: Secondary | ICD-10-CM | POA: Diagnosis not present

## 2023-12-01 DIAGNOSIS — R799 Abnormal finding of blood chemistry, unspecified: Secondary | ICD-10-CM | POA: Diagnosis not present

## 2023-12-01 LAB — CBC
HCT: 39.3 % (ref 36.0–46.0)
Hemoglobin: 12.7 g/dL (ref 12.0–15.0)
MCHC: 32.4 g/dL (ref 30.0–36.0)
MCV: 81.3 fl (ref 78.0–100.0)
Platelets: 280 10*3/uL (ref 150.0–400.0)
RBC: 4.83 Mil/uL (ref 3.87–5.11)
RDW: 15.4 % (ref 11.5–15.5)
WBC: 8.6 10*3/uL (ref 4.0–10.5)

## 2023-12-01 LAB — FERRITIN: Ferritin: 38.1 ng/mL (ref 10.0–291.0)

## 2023-12-01 LAB — BASIC METABOLIC PANEL
BUN: 27 mg/dL — ABNORMAL HIGH (ref 6–23)
CO2: 28 meq/L (ref 19–32)
Calcium: 9.4 mg/dL (ref 8.4–10.5)
Chloride: 103 meq/L (ref 96–112)
Creatinine, Ser: 1.13 mg/dL (ref 0.40–1.20)
GFR: 48.33 mL/min — ABNORMAL LOW (ref >60.00)
Glucose, Bld: 76 mg/dL (ref 70–99)
Potassium: 4.1 meq/L (ref 3.5–5.1)
Sodium: 141 meq/L (ref 135–145)

## 2023-12-01 MED ORDER — IBANDRONATE SODIUM 150 MG PO TABS
150.0000 mg | ORAL_TABLET | ORAL | 4 refills | Status: AC
Start: 2023-12-01 — End: ?

## 2023-12-02 ENCOUNTER — Encounter: Payer: Self-pay | Admitting: Family Medicine

## 2023-12-02 NOTE — Addendum Note (Signed)
 Addended by: Abbe Amsterdam C on: 12/02/2023 12:24 PM   Modules accepted: Orders

## 2023-12-18 ENCOUNTER — Ambulatory Visit: Payer: Self-pay | Admitting: Family Medicine

## 2023-12-18 NOTE — Telephone Encounter (Signed)
 Patient called requesting to go discuss lab results pertaining to her iron. This RN reviewed labs in chart. Patient has additional questions for provider. Patient also requested lab results to be mailed. Please advise.   Copied from CRM [id number]. Topic: Clinical - Lab/Test Results >> Dec 18, 2023  4:17 PM [place] E wrote: Reason for CRM: Patient called in wanting to go over her lab results from March 10th. Reason for Disposition . [1] Caller requesting NON-URGENT health information AND [2] PCP's office is the best resource  Protocols used: Information Only Call - No Triage-A-AH

## 2023-12-18 NOTE — Telephone Encounter (Signed)
 Called patient, she had not seen her MyChart message.  Went over results, asked her to come in for a repeat BMP in mid April.  She plans to do so  She also like a copy of her labs sent to her

## 2024-01-21 ENCOUNTER — Ambulatory Visit: Payer: PPO | Admitting: Urology

## 2024-01-21 ENCOUNTER — Encounter: Payer: Self-pay | Admitting: Urology

## 2024-01-21 ENCOUNTER — Other Ambulatory Visit (INDEPENDENT_AMBULATORY_CARE_PROVIDER_SITE_OTHER)

## 2024-01-21 VITALS — BP 129/78 | HR 89 | Ht 64.0 in | Wt 179.0 lb

## 2024-01-21 DIAGNOSIS — Z87440 Personal history of urinary (tract) infections: Secondary | ICD-10-CM | POA: Diagnosis not present

## 2024-01-21 DIAGNOSIS — R799 Abnormal finding of blood chemistry, unspecified: Secondary | ICD-10-CM

## 2024-01-21 DIAGNOSIS — R8290 Unspecified abnormal findings in urine: Secondary | ICD-10-CM

## 2024-01-21 DIAGNOSIS — N3941 Urge incontinence: Secondary | ICD-10-CM | POA: Diagnosis not present

## 2024-01-21 LAB — URINALYSIS, ROUTINE W REFLEX MICROSCOPIC
Bilirubin, UA: NEGATIVE
Glucose, UA: NEGATIVE
Ketones, UA: NEGATIVE
Nitrite, UA: NEGATIVE
Specific Gravity, UA: >1.03 — ABNORMAL HIGH (ref 1.005–1.030)
Urobilinogen, Ur: 1 mg/dL (ref 0.2–1.0)
pH, UA: 6 (ref 5.0–7.5)

## 2024-01-21 LAB — MICROSCOPIC EXAMINATION: WBC, UA: >30 /HPF — AB (ref 0–5)

## 2024-01-21 MED ORDER — SOLIFENACIN SUCCINATE 10 MG PO TABS: 10.0000 mg | ORAL_TABLET | Freq: Every day | ORAL | 11 refills | Status: DC
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
## 2024-01-22 ENCOUNTER — Encounter: Payer: Self-pay | Admitting: Family Medicine

## 2024-01-22 LAB — BASIC METABOLIC PANEL WITH GFR
BUN: 19 mg/dL (ref 6–23)
CO2: 26 meq/L (ref 19–32)
Calcium: 8.9 mg/dL (ref 8.4–10.5)
Chloride: 105 meq/L (ref 96–112)
Creatinine, Ser: 0.91 mg/dL (ref 0.40–1.20)
GFR: 62.61 mL/min (ref >60.00)
Glucose, Bld: 95 mg/dL (ref 70–99)
Potassium: 3.8 meq/L (ref 3.5–5.1)
Sodium: 138 meq/L (ref 135–145)

## 2024-01-25 LAB — URINE CULTURE

## 2024-01-26 MED ORDER — SULFAMETHOXAZOLE-TRIMETHOPRIM 800-160 MG PO TABS: 1.0000 | ORAL_TABLET | Freq: Two times a day (BID) | ORAL | 0 refills | Status: AC

## 2024-01-26 NOTE — Addendum Note (Signed)
 Addended by: Mellie Sprinkle on: 01/26/2024 09:03 AM   Modules accepted: Orders

## 2024-03-02 ENCOUNTER — Other Ambulatory Visit: Payer: Self-pay | Admitting: Urology

## 2024-03-02 ENCOUNTER — Telehealth: Payer: Self-pay | Admitting: Urology

## 2024-03-02 DIAGNOSIS — R8290 Unspecified abnormal findings in urine: Secondary | ICD-10-CM

## 2024-03-02 NOTE — Telephone Encounter (Signed)
 Patient called and stated that she lost medication that was sent in after only taking one. Which is the solifenacin  (VESICARE ) 10 MG tablet  I made pt aware that she has refills on that medication. Pt stated she is not having any uti symptoms currently and she never picked up the bactrim  that was sent in on 01/26/24 . Pt canceled her upcoming apt and did not reschedule until July. Just wanted to make you aware.

## 2024-03-04 ENCOUNTER — Ambulatory Visit: Admitting: Urology

## 2024-03-08 ENCOUNTER — Telehealth: Payer: Self-pay

## 2024-03-08 DIAGNOSIS — Z87440 Personal history of urinary (tract) infections: Secondary | ICD-10-CM

## 2024-03-08 NOTE — Telephone Encounter (Signed)
 Pt pharmacy sent a refill of cefdinir  300mg .  Per message 03/02/24- pt canceled her 6wk f/u d/t not having current UTI s/s. Please advise.

## 2024-03-09 ENCOUNTER — Other Ambulatory Visit (HOSPITAL_BASED_OUTPATIENT_CLINIC_OR_DEPARTMENT_OTHER): Payer: Self-pay

## 2024-03-09 ENCOUNTER — Encounter: Payer: Self-pay | Admitting: Urology

## 2024-03-09 ENCOUNTER — Telehealth: Payer: Self-pay

## 2024-03-09 ENCOUNTER — Other Ambulatory Visit: Payer: Self-pay | Admitting: Urology

## 2024-03-09 ENCOUNTER — Other Ambulatory Visit

## 2024-03-09 ENCOUNTER — Encounter: Payer: Self-pay | Admitting: Family

## 2024-03-09 DIAGNOSIS — Z87440 Personal history of urinary (tract) infections: Secondary | ICD-10-CM

## 2024-03-09 LAB — URINALYSIS, ROUTINE W REFLEX MICROSCOPIC
Glucose, UA: NEGATIVE
Nitrite, UA: POSITIVE — AB
Specific Gravity, UA: 1.025 (ref 1.005–1.030)
Urobilinogen, Ur: 1 mg/dL (ref 0.2–1.0)
pH, UA: 5.5 (ref 5.0–7.5)

## 2024-03-09 LAB — MICROSCOPIC EXAMINATION: WBC, UA: >30 /HPF — AB (ref 0–5)

## 2024-03-09 MED ORDER — SULFAMETHOXAZOLE-TRIMETHOPRIM 800-160 MG PO TABS: 1.0000 | ORAL_TABLET | Freq: Two times a day (BID) | ORAL | 0 refills | Status: DC

## 2024-03-09 MED ORDER — SULFAMETHOXAZOLE-TRIMETHOPRIM 800-160 MG PO TABS
1.0000 | ORAL_TABLET | Freq: Two times a day (BID) | ORAL | 0 refills | Status: DC
  Filled 2024-03-09: qty 10, 5d supply, fill #0

## 2024-03-09 MED ORDER — SULFAMETHOXAZOLE-TRIMETHOPRIM 800-160 MG PO TABS
1.0000 | ORAL_TABLET | Freq: Two times a day (BID) | ORAL | 0 refills | Status: AC
  Filled 2024-03-09: qty 10, 5d supply, fill #0

## 2024-03-09 NOTE — Telephone Encounter (Signed)
 Spoke with pt in reference to cefdinir  refill. Pt stated that she does think she has a UTI. Made pt aware will need a urine for u/a and possible cx. Pt voiced understanding. Lab appt made.

## 2024-03-09 NOTE — Telephone Encounter (Signed)
 Pt made aware of bactrim  and urine being sent for cx. Pt voiced understanding. Pt requested medication be sent to pharmacy at [facility] due to cost.

## 2024-03-09 NOTE — Telephone Encounter (Signed)
-----   Message from Oda Bence sent at 03/09/2024  3:03 PM EDT ----- Could you please let her know that I sent in a rx for Bactrim  for her to start taking? We will contact her when the culture is available.

## 2024-03-12 ENCOUNTER — Ambulatory Visit: Payer: Self-pay | Admitting: Urology

## 2024-03-12 DIAGNOSIS — R8290 Unspecified abnormal findings in urine: Secondary | ICD-10-CM

## 2024-03-12 LAB — URINE CULTURE

## 2024-03-12 MED ORDER — CEFDINIR 300 MG PO CAPS: 300.0000 mg | ORAL_CAPSULE | Freq: Two times a day (BID) | ORAL | 0 refills | Status: AC

## 2024-04-07 ENCOUNTER — Encounter: Payer: Self-pay | Admitting: Urology

## 2024-04-07 ENCOUNTER — Ambulatory Visit: Admitting: Urology

## 2024-04-07 VITALS — BP 141/76 | HR 83

## 2024-04-07 DIAGNOSIS — N3941 Urge incontinence: Secondary | ICD-10-CM | POA: Diagnosis not present

## 2024-04-07 DIAGNOSIS — R8290 Unspecified abnormal findings in urine: Secondary | ICD-10-CM | POA: Diagnosis not present

## 2024-04-07 DIAGNOSIS — Z87440 Personal history of urinary (tract) infections: Secondary | ICD-10-CM | POA: Diagnosis not present

## 2024-04-07 DIAGNOSIS — N390 Urinary tract infection, site not specified: Secondary | ICD-10-CM

## 2024-04-07 LAB — URINALYSIS, ROUTINE W REFLEX MICROSCOPIC
Bilirubin, UA: NEGATIVE
Glucose, UA: NEGATIVE
Ketones, UA: NEGATIVE
Nitrite, UA: POSITIVE — AB
Specific Gravity, UA: 1.02 (ref 1.005–1.030)
Urobilinogen, Ur: 1 mg/dL (ref 0.2–1.0)
pH, UA: 5.5 (ref 5.0–7.5)

## 2024-04-07 LAB — MICROSCOPIC EXAMINATION: WBC, UA: >30 /HPF — AB (ref 0–5)

## 2024-04-07 MED ORDER — NITROFURANTOIN MONOHYD MACRO 100 MG PO CAPS: 100.0000 mg | ORAL_CAPSULE | Freq: Two times a day (BID) | ORAL | 0 refills | Status: DC
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
## 2024-04-10 LAB — URINE CULTURE

## 2024-04-12 ENCOUNTER — Ambulatory Visit: Payer: Self-pay | Admitting: Urology

## 2024-04-12 DIAGNOSIS — N390 Urinary tract infection, site not specified: Secondary | ICD-10-CM

## 2024-04-12 MED ORDER — CEPHALEXIN 500 MG PO CAPS: 500.0000 mg | ORAL_CAPSULE | Freq: Every day | ORAL | 2 refills | Status: DC

## 2024-04-12 MED ORDER — CIPROFLOXACIN HCL 500 MG PO TABS: 500.0000 mg | ORAL_TABLET | Freq: Two times a day (BID) | ORAL | 0 refills | Status: AC

## 2024-06-09 ENCOUNTER — Ambulatory Visit (INDEPENDENT_AMBULATORY_CARE_PROVIDER_SITE_OTHER): Admitting: Urology

## 2024-06-09 ENCOUNTER — Encounter: Payer: Self-pay | Admitting: Urology

## 2024-06-09 VITALS — BP 152/75 | HR 76

## 2024-06-09 DIAGNOSIS — N390 Urinary tract infection, site not specified: Secondary | ICD-10-CM

## 2024-06-09 DIAGNOSIS — N3941 Urge incontinence: Secondary | ICD-10-CM | POA: Diagnosis not present

## 2024-06-09 DIAGNOSIS — R8290 Unspecified abnormal findings in urine: Secondary | ICD-10-CM | POA: Diagnosis not present

## 2024-06-09 LAB — URINALYSIS, ROUTINE W REFLEX MICROSCOPIC
Bilirubin, UA: NEGATIVE
Glucose, UA: NEGATIVE
Ketones, UA: NEGATIVE
Nitrite, UA: POSITIVE — AB
Protein,UA: NEGATIVE
Specific Gravity, UA: 1.015 (ref 1.005–1.030)
Urobilinogen, Ur: 0.2 mg/dL (ref 0.2–1.0)
pH, UA: 6 (ref 5.0–7.5)

## 2024-06-09 LAB — MICROSCOPIC EXAMINATION

## 2024-06-09 MED ORDER — CEFDINIR 300 MG PO CAPS: 300.0000 mg | ORAL_CAPSULE | Freq: Two times a day (BID) | ORAL | 0 refills | Status: AC
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
## 2024-06-12 LAB — URINE CULTURE

## 2024-06-14 ENCOUNTER — Encounter: Payer: Self-pay | Admitting: Family Medicine

## 2024-06-14 ENCOUNTER — Ambulatory Visit (INDEPENDENT_AMBULATORY_CARE_PROVIDER_SITE_OTHER): Admitting: Family Medicine

## 2024-06-14 VITALS — BP 110/70 | HR 84 | Temp 97.6°F | Resp 20 | Ht 64.0 in | Wt 168.6 lb

## 2024-06-14 DIAGNOSIS — J45991 Cough variant asthma: Secondary | ICD-10-CM

## 2024-06-14 MED ORDER — AIRSUPRA 90-80 MCG/ACT IN AERO: 2.0000 | INHALATION_SPRAY | Freq: Four times a day (QID) | RESPIRATORY_TRACT | 2 refills | Status: AC | PRN
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
## 2024-06-14 NOTE — Patient Instructions (Signed)
 Please get your X-ray done at the [facility] in [place]: [address], [place], [place] [address] [phone]  You do not need an appointment for this location.   Let me know if there are cost issues with the inhaler.   Let us  know if you need anything.

## 2024-06-15 ENCOUNTER — Ambulatory Visit: Payer: Self-pay | Admitting: Urology

## 2024-06-15 ENCOUNTER — Ambulatory Visit (INDEPENDENT_AMBULATORY_CARE_PROVIDER_SITE_OTHER)

## 2024-06-15 VITALS — Ht 64.0 in | Wt 168.0 lb

## 2024-06-15 DIAGNOSIS — Z0000 Encounter for general adult medical examination without abnormal findings: Secondary | ICD-10-CM

## 2024-06-15 NOTE — Patient Instructions (Addendum)
 Cathy Gallegos,  Thank you for taking the time for your Medicare Wellness Visit. I appreciate your continued commitment to your health goals. Please review the care plan we discussed, and feel free to reach out if I can assist you further.  Medicare recommends these wellness visits once per year to help you and your care team stay ahead of potential health issues. These visits are designed to focus on prevention, allowing your provider to concentrate on managing your acute and chronic conditions during your regular appointments.  Please note that Annual Wellness Visits do not include a physical exam. Some assessments may be limited, especially if the visit was conducted virtually. If needed, we may recommend a separate in-person follow-up with your provider.  Ongoing Care Seeing your primary care provider every 3 to 6 months helps us  monitor your health and provide consistent, personalized care.   Referrals If a referral was made during today's visit and you haven't received any updates within two weeks, please contact the referred provider directly to check on the status.  Recommended Screenings:  Health Maintenance  Topic Date Due   Zoster (Shingles) Vaccine (1 of 2) 08[birth year]05/1969   COVID-19 Vaccine (3 - Pfizer risk series) 02/23/2020   Flu Shot  04/23/2024   Medicare Annual Wellness Visit  06/15/2025   Colon Cancer Screening  06/29/2025   Breast Cancer Screening  10/26/2025   DEXA scan (bone density measurement)  10/26/2025   DTaP/Tdap/Td vaccine (2 - Tdap) 05/01/2027   Pneumococcal Vaccine for age over 96  Completed   Hepatitis C Screening  Completed   HPV Vaccine  Aged Out   Meningitis B Vaccine  Aged Out       06/15/2024    3:18 PM  Advanced Directives  Does Patient Have a Medical Advance Directive? No  Would patient like information on creating a medical advance directive? No - Patient declined   Advance Care Planning is important because it: Ensures you receive medical care that  aligns with your values, goals, and preferences. Provides guidance to your family and loved ones, reducing the emotional burden of decision-making during critical moments.  Vision: Annual vision screenings are recommended for early detection of glaucoma, cataracts, and diabetic retinopathy. These exams can also reveal signs of chronic conditions such as diabetes and high blood pressure.  Dental: Annual dental screenings help detect early signs of oral cancer, gum disease, and other conditions linked to overall health, including heart disease and diabetes.  Please see the attached documents for additional preventive care recommendations.
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
## 2024-06-16 ENCOUNTER — Ambulatory Visit

## 2024-06-16 DIAGNOSIS — R053 Chronic cough: Secondary | ICD-10-CM | POA: Diagnosis not present

## 2024-06-16 DIAGNOSIS — J45991 Cough variant asthma: Secondary | ICD-10-CM | POA: Diagnosis not present

## 2024-06-16 DIAGNOSIS — K449 Diaphragmatic hernia without obstruction or gangrene: Secondary | ICD-10-CM | POA: Diagnosis not present

## 2024-06-21 ENCOUNTER — Ambulatory Visit: Payer: Self-pay | Admitting: Family Medicine

## 2024-07-15 ENCOUNTER — Ambulatory Visit: Payer: Self-pay
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
## 2024-07-16 NOTE — Telephone Encounter (Signed)
 Appt scheduled
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
## 2024-07-19 ENCOUNTER — Ambulatory Visit: Admitting: Family Medicine

## 2024-07-19 ENCOUNTER — Encounter: Payer: Self-pay | Admitting: Family

## 2024-07-19 ENCOUNTER — Emergency Department (HOSPITAL_BASED_OUTPATIENT_CLINIC_OR_DEPARTMENT_OTHER)
Admission: EM | Admit: 2024-07-19 | Discharge: 2024-07-19 | Disposition: A | Discharge status: Home / Self Care | Attending: Emergency Medicine | Admitting: Emergency Medicine

## 2024-07-19 ENCOUNTER — Encounter: Payer: Self-pay | Admitting: Family Medicine

## 2024-07-19 ENCOUNTER — Emergency Department (HOSPITAL_BASED_OUTPATIENT_CLINIC_OR_DEPARTMENT_OTHER)

## 2024-07-19 ENCOUNTER — Encounter (HOSPITAL_BASED_OUTPATIENT_CLINIC_OR_DEPARTMENT_OTHER): Payer: Self-pay | Admitting: *Deleted

## 2024-07-19 ENCOUNTER — Other Ambulatory Visit (HOSPITAL_BASED_OUTPATIENT_CLINIC_OR_DEPARTMENT_OTHER): Payer: Self-pay

## 2024-07-19 ENCOUNTER — Other Ambulatory Visit: Payer: Self-pay

## 2024-07-19 VITALS — BP 132/74 | HR 91 | Temp 97.8°F | Ht 64.0 in | Wt 168.6 lb

## 2024-07-19 DIAGNOSIS — R42 Dizziness and giddiness: Secondary | ICD-10-CM

## 2024-07-19 DIAGNOSIS — R410 Disorientation, unspecified: Secondary | ICD-10-CM | POA: Diagnosis not present

## 2024-07-19 DIAGNOSIS — Z91040 Latex allergy status: Secondary | ICD-10-CM | POA: Insufficient documentation

## 2024-07-19 DIAGNOSIS — Z79899 Other long term (current) drug therapy: Secondary | ICD-10-CM | POA: Diagnosis not present

## 2024-07-19 LAB — URINALYSIS, ROUTINE W REFLEX MICROSCOPIC
Bilirubin Urine: NEGATIVE
Glucose, UA: NEGATIVE mg/dL
Ketones, ur: NEGATIVE mg/dL
Nitrite: NEGATIVE
Protein, ur: NEGATIVE mg/dL
Specific Gravity, Urine: 1.025 (ref 1.005–1.030)
pH: 5.5 (ref 5.0–8.0)

## 2024-07-19 LAB — APTT: aPTT: 27 s (ref 24–36)

## 2024-07-19 LAB — COMPREHENSIVE METABOLIC PANEL WITH GFR
ALT: 10 U/L (ref 0–44)
AST: 17 U/L (ref 15–41)
Albumin: 4 g/dL (ref 3.5–5.0)
Alkaline Phosphatase: 87 U/L (ref 38–126)
Anion gap: 12 (ref 5–15)
BUN: 24 mg/dL — ABNORMAL HIGH (ref 8–23)
CO2: 23 mmol/L (ref 22–32)
Calcium: 9.4 mg/dL (ref 8.9–10.3)
Chloride: 108 mmol/L (ref 98–111)
Creatinine, Ser: 1 mg/dL (ref 0.44–1.00)
GFR, Estimated: 59 mL/min — ABNORMAL LOW (ref >60)
Glucose, Bld: 121 mg/dL — ABNORMAL HIGH (ref 70–99)
Potassium: 3.8 mmol/L (ref 3.5–5.1)
Sodium: 142 mmol/L (ref 135–145)
Total Bilirubin: 0.2 mg/dL (ref 0.0–1.2)
Total Protein: 6.4 g/dL — ABNORMAL LOW (ref 6.5–8.1)

## 2024-07-19 LAB — DIFFERENTIAL
Abs Immature Granulocytes: 0.03 K/uL (ref 0.00–0.07)
Basophils Absolute: 0 K/uL (ref 0.0–0.1)
Basophils Relative: 0 %
Eosinophils Absolute: 0.3 K/uL (ref 0.0–0.5)
Eosinophils Relative: 3 %
Immature Granulocytes: 0 %
Lymphocytes Relative: 28 %
Lymphs Abs: 2.6 K/uL (ref 0.7–4.0)
Monocytes Absolute: 0.5 K/uL (ref 0.1–1.0)
Monocytes Relative: 5 %
Neutro Abs: 5.8 K/uL (ref 1.7–7.7)
Neutrophils Relative %: 64 %

## 2024-07-19 LAB — CBG MONITORING, ED: Glucose-Capillary: 131 mg/dL — ABNORMAL HIGH (ref 70–99)

## 2024-07-19 LAB — CBC
HCT: 35.1 % — ABNORMAL LOW (ref 36.0–46.0)
Hemoglobin: 11 g/dL — ABNORMAL LOW (ref 12.0–15.0)
MCH: 25.7 pg — ABNORMAL LOW (ref 26.0–34.0)
MCHC: 31.3 g/dL (ref 30.0–36.0)
MCV: 82 fL (ref 80.0–100.0)
Platelets: 287 K/uL (ref 150–400)
RBC: 4.28 MIL/uL (ref 3.87–5.11)
RDW: 15.8 % — ABNORMAL HIGH (ref 11.5–15.5)
WBC: 9.2 K/uL (ref 4.0–10.5)
nRBC: 0 % (ref 0.0–0.2)

## 2024-07-19 LAB — URINALYSIS, MICROSCOPIC (REFLEX)

## 2024-07-19 LAB — ETHANOL: Alcohol, Ethyl (B): <15 mg/dL (ref <15)

## 2024-07-19 LAB — PROTIME-INR
INR: 1 (ref 0.8–1.2)
Prothrombin Time: 13.6 s (ref 11.4–15.2)

## 2024-07-19 MED ORDER — MECLIZINE HCL 25 MG PO TABS
25.0000 mg | ORAL_TABLET | Freq: Three times a day (TID) | ORAL | 0 refills | Status: AC | PRN
  Filled 2024-07-19: qty 30, 10d supply, fill #0

## 2024-07-19 MED ORDER — SODIUM CHLORIDE 0.9% FLUSH
3.0000 mL | Freq: Once | INTRAVENOUS | Status: DC
  Filled 2024-07-19: qty 3

## 2024-07-19 MED ORDER — ONDANSETRON 4 MG PO TBDP
4.0000 mg | ORAL_TABLET | Freq: Three times a day (TID) | ORAL | 0 refills | Status: AC | PRN
  Filled 2024-07-19: qty 20, 7d supply, fill #0

## 2024-07-19 NOTE — ED Triage Notes (Signed)
 Pt states that she drove herself here from home where she she lives independently.  She states that she has ha some dizziness and nausea for a few week.  Pt states that the dizziness is worse with movement and the nausea comes with the dizziness.  No focal weakness.

## 2024-07-19 NOTE — Discharge Instructions (Signed)
 As we discussed I did recommend that you went to [facility] tonight for an MRI to make sure this is not a stroke.  I prescribed you meclizine  and Zofran  in case this is a peripheral vertigo and an inner ear problem.  I given you the number for an ear doctor named Dr. Carlie as well.  I recommend that you go to [place] for MRI if you change your mind about further evaluating for stroke.  If you would want to try to pursue this MRI outpatient you could talk to your primary care doctor to see how quickly they can get an MRI set up.  But I do think you to benefit from emergent MRI given your symptoms.

## 2024-07-19 NOTE — ED Provider Notes (Signed)
 [place] [facility] AT [facility] Provider Note   CSN: [id number] Arrival date & time: 07/19/24  1359     Patient presents with: Dizziness   Cathy Gallegos is a 73 y.o. female.   Patient here with dizziness for the last several weeks but she cannot really tell me when it started.  Worse the last few days.  She feels worse with movement and she feels nauseous.  She has a history of depression sleep apnea concussion migraines.  She denies any headache at this time.  She denies any fevers or chills.  She states that she is tired and would like to go home.  She was sent here by her primary care doctor who evaluated her today.  She denies being confused.  She denies any fever or chills.  No headache no neck pain no weakness numbness or tingling or vision loss of speech changes.  She has been having these intermittent symptoms here recently.  Sometimes it is hard for her to keep her balance.  She denies any chest pain shortness of breath.  The history is provided by the patient.       Prior to Admission medications   Medication Sig Start Date End Date Taking? Authorizing Provider  meclizine  (ANTIVERT ) 25 MG tablet Take 1 tablet (25 mg total) by mouth 3 (three) times daily as needed for dizziness. 07/19/24  Yes Wymon Swaney, DO  ondansetron  (ZOFRAN -ODT) 4 MG disintegrating tablet Take 1 tablet (4 mg total) by mouth every 8 (eight) hours as needed. 07/19/24  Yes Sherrey North, DO  Albuterol-Budesonide (AIRSUPRA ) 90-80 MCG/ACT AERO Inhale 2 puffs into the lungs every 6 (six) hours as needed (Cough, shortness of breath). 06/14/24   Frann Mabel Mt, DO  cephALEXin  (KEFLEX ) 500 MG capsule Take 1 capsule (500 mg total) by mouth daily. Begin after completing Cipro  Patient not taking: Reported on 07/19/2024 04/12/24   Roseann Adine PARAS., MD  EPINEPHrine  (EPIPEN  2-PAK) 0.3 mg/0.3 mL IJ SOAJ injection Inject 0.3 mg into the muscle as needed for anaphylaxis. 11/07/21    Jeneal Danita Macintosh, MD  ibandronate  (BONIVA ) 150 MG tablet Take 1 tablet (150 mg total) by mouth every 30 (thirty) days. Take in the morning with a full glass of water, on an empty stomach, and do not take anything else by mouth or lie down for the next 30 min. 12/01/23   Copland, Harlene BROCKS, MD  ibuprofen  (ADVIL ) 200 MG tablet Take 200 mg by mouth every 8 (eight) hours as needed. 07/15/16   [provider]  PARoxetine  (PAXIL ) 40 MG tablet Take 1 tablet (40 mg total) by mouth daily. 07/14/23   Copland, Harlene BROCKS, MD  solifenacin  (VESICARE ) 10 MG tablet Take 1 tablet (10 mg total) by mouth daily. 01/21/24   Stoneking, Adine PARAS., MD    Allergies: Gabapentin , Dust mite mixed allergen ext [mite (d. farinae)], Molds & smuts, and Latex    Review of Systems  Updated Vital Signs BP 129/89 (BP Location: Left Arm)   Pulse 86   Temp 98.7 F (37.1 C)   Resp 20   SpO2 96%   Physical Exam Vitals and nursing note reviewed.  Constitutional:      General: She is not in acute distress.    Appearance: She is well-developed. She is not ill-appearing.  HENT:     Head: Normocephalic and atraumatic.     Nose: Nose normal.     Mouth/Throat:     Mouth: Mucous membranes are moist.  Eyes:     Extraocular Movements: Extraocular movements intact.     Conjunctiva/sclera: Conjunctivae normal.     Pupils: Pupils are equal, round, and reactive to light.  Cardiovascular:     Rate and Rhythm: Normal rate and regular rhythm.     Pulses: Normal pulses.     Heart sounds: Normal heart sounds. No murmur heard. Pulmonary:     Effort: Pulmonary effort is normal. No respiratory distress.     Breath sounds: Normal breath sounds.  Abdominal:     General: Abdomen is flat.     Palpations: Abdomen is soft.     Tenderness: There is no abdominal tenderness.  Musculoskeletal:        General: No swelling.     Cervical back: Normal range of motion and neck supple.  Skin:    General: Skin is warm and dry.      Capillary Refill: Capillary refill takes less than 2 seconds.  Neurological:     General: No focal deficit present.     Mental Status: She is alert and oriented to person, place, and time.     Cranial Nerves: No cranial nerve deficit.     Sensory: No sensory deficit.     Motor: No weakness.     Coordination: Coordination normal.     Comments: Slightly off balance when she walks but she is able to do so, normal strength normal sensation normal speech normal visual fields no obvious nystagmus answers questions appropriately  Psychiatric:        Mood and Affect: Mood normal.     (all labs ordered are listed, but only abnormal results are displayed) Labs Reviewed  CBC - Abnormal; Notable for the following components:      Result Value   Hemoglobin 11.0 (*)    HCT 35.1 (*)    MCH 25.7 (*)    RDW 15.8 (*)    All other components within normal limits  COMPREHENSIVE METABOLIC PANEL WITH GFR - Abnormal; Notable for the following components:   Glucose, Bld 121 (*)    BUN 24 (*)    Total Protein 6.4 (*)    GFR, Estimated 59 (*)    All other components within normal limits  URINALYSIS, ROUTINE W REFLEX MICROSCOPIC - Abnormal; Notable for the following components:   Hgb urine dipstick TRACE (*)    Leukocytes,Ua TRACE (*)    All other components within normal limits  URINALYSIS, MICROSCOPIC (REFLEX) - Abnormal; Notable for the following components:   Bacteria, UA RARE (*)    All other components within normal limits  CBG MONITORING, ED - Abnormal; Notable for the following components:   Glucose-Capillary 131 (*)    All other components within normal limits  PROTIME-INR  APTT  DIFFERENTIAL  ETHANOL    EKG: EKG Interpretation Date/Time:  Monday July 19 2024 15:16:19 EDT Ventricular Rate:  85 PR Interval:  171 QRS Duration:  88 QT Interval:  394 QTC Calculation: 469 R Axis:   76  Text Interpretation: Sinus rhythm Confirmed by Ruthe Cornet [phone]) on 07/19/2024 3:24:03  PM  Radiology: CT HEAD WO CONTRAST Result Date: 07/19/2024 CLINICAL DATA:  Vertigo dizziness EXAM: CT HEAD WITHOUT CONTRAST TECHNIQUE: Contiguous axial images were obtained from the base of the skull through the vertex without intravenous contrast. RADIATION DOSE REDUCTION: This exam was performed according to the departmental dose-optimization program which includes automated exposure control, adjustment of the mA and/or kV according to patient size and/or use of iterative reconstruction technique.  COMPARISON:  08/16/2022 FINDINGS: Brain: No acute territorial infarction, hemorrhage or intracranial mass. Mild atrophy and chronic small vessel ischemic changes of the white matter. Stable ventricle size. Vascular: No hyperdense vessels.  No unexpected calcification Skull: Normal. Negative for fracture or focal lesion. Sinuses/Orbits: No acute finding. Other: None IMPRESSION: 1. No CT evidence for acute intracranial abnormality. 2. Atrophy and chronic small vessel ischemic changes of the white matter. Electronically Signed   By: Luke Bun M.D.   On: 07/19/2024 15:42     Procedures   Medications Ordered in the ED  sodium chloride  flush (NS) 0.9 % injection 3 mL (has no administration in time range)                                    Medical Decision Making Amount and/or Complexity of Data Reviewed Labs: ordered. Radiology: ordered.   [name] is here with dizziness.  History of concussion and migraines.  Sent by primary care doctor who was concerned for vertigo but she was maybe having some confusion and some difficulty walking.  Patient denies any confusion.  She says she is tired and wants to go home.  On my exam she does have a little bit of difficulty walking but able to do so on her own.  She does not appear confused.  She answers questions appropriately.  She cannot really tell me when the dizziness started.  Sounds like it has been worse the last few days it may be ongoing for  several weeks.  She denies any headache neck pain fever chills.  EKG in triage shows sinus rhythm.  No ischemic changes.  Lab work showed no significant leukocytosis anemia or electrolyte abnormality.  Urinalysis negative for infection.  Head CT was unremarkable.  Overall it does sound like a peripheral vertigo process but I did recommend that she went for an MRI to further evaluate for stroke given that I cannot really tell when her symptoms started.  I have a lower suspicion that there is some sort of thrombosis or other process.  Her neuroexam is overall unremarkable except for some imbalance when she walks.  Do not see any obvious nystagmus.  Patient declined MRI.  She understood the risks and benefits of this.  Will prescribe her meclizine  and Zofran  and she will try to have MRI arranged outpatient with her primary care doctor but I did recommend that she go to [facility] if she changes her mind about this.  I have no other concern for neuromuscular process or infectious process at this time.  Gave her number to follow-up with ENT if stroke workup is completed and unremarkable.  Patient discharged in good condition.  Understands return precautions.  This chart was dictated using voice recognition software.  Despite best efforts to proofread,  errors can occur which can change the documentation meaning.      Final diagnoses:  Dizziness    ED Discharge Orders          Ordered    meclizine  (ANTIVERT ) 25 MG tablet  3 times daily PRN        07/19/24 1610    ondansetron  (ZOFRAN -ODT) 4 MG disintegrating tablet  Every 8 hours PRN        07/19/24 1610               Broly Hatfield, Juliene, DO 07/19/24 1616

## 2024-07-20 ENCOUNTER — Telehealth: Payer: Self-pay

## 2024-07-20 DIAGNOSIS — R42 Dizziness and giddiness: Secondary | ICD-10-CM

## 2024-07-20 DIAGNOSIS — R410 Disorientation, unspecified: Secondary | ICD-10-CM

## 2024-07-20 NOTE — Telephone Encounter (Signed)
 Copied from CRM [id number]. Topic: Clinical - Medical Advice >> Jul 20, 2024  2:04 PM Mercedes MATSU wrote: Reason for CRM: Patient needed to contact imaging to schedule her MRI. >> Jul 20, 2024  3:06 PM China J wrote: The patient is calling to see if Dr. Watt could send an order in for an MRI. She was seen by a doctor at the hospital but was never transported for imaging since she did not want to go initially.

## 2024-07-20 NOTE — Telephone Encounter (Signed)
 Copied from CRM [id number]. Topic: Clinical - Medical Advice >> Jul 20, 2024  2:04 PM Cathy Gallegos wrote: Reason for CRM: Patient needed to contact imaging to schedule her MRI.

## 2024-07-21 ENCOUNTER — Ambulatory Visit: Admitting: Urology

## 2024-07-21 NOTE — Addendum Note (Signed)
 Addended by: WATT RAISIN C on: 07/21/2024 12:17 PM   Modules accepted: Orders

## 2024-07-22 ENCOUNTER — Other Ambulatory Visit

## 2024-07-26 ENCOUNTER — Telehealth: Payer: Self-pay | Admitting: Family Medicine

## 2024-07-26 ENCOUNTER — Ambulatory Visit
Admission: RE | Admit: 2024-07-26 | Discharge: 2024-07-26 | Disposition: A | Discharge status: Home / Self Care | Source: Ambulatory Visit | Attending: Family Medicine | Admitting: Family Medicine

## 2024-07-26 DIAGNOSIS — R410 Disorientation, unspecified: Secondary | ICD-10-CM | POA: Diagnosis not present

## 2024-07-26 DIAGNOSIS — R42 Dizziness and giddiness: Secondary | ICD-10-CM

## 2024-07-26 NOTE — Telephone Encounter (Unsigned)
 Copied from CRM [id number]. Topic: Clinical - Lab/Test Results >> Jul 26, 2024 11:17 AM Alfonso HERO wrote: Reason for CRM: patient is requesting someone to call her when the MRI results come in she doesn't know how to use mychart.

## 2024-07-26 NOTE — Telephone Encounter (Signed)
 Copied from CRM [id number]. Topic: Clinical - Lab/Test Results >> Jul 26, 2024 11:17 AM Alfonso HERO wrote: Reason for CRM: patient is requesting someone to call her when the MRI results come in she doesn't know how to use mychart.

## 2024-07-26 NOTE — Telephone Encounter (Signed)
 Noted

## 2024-07-30 ENCOUNTER — Telehealth: Payer: Self-pay

## 2024-07-30 NOTE — Telephone Encounter (Signed)
 Copied from CRM #[id number]. Topic: Clinical - Lab/Test Results >> Jul 30, 2024  4:11 PM Sasha M wrote: Reason for CRM: Pt called to request the results of her MRI. Please call pt when available to discuss

## 2024-07-31 NOTE — Telephone Encounter (Signed)
 I called pt regarding her recent brain MRI and went over the results  She did see Dr Onita in 2023; I will reach out and see if she can bring Darline back in for a recheck visit    IMPRESSION: 1. Evidence of disproportionate frontoparietal brain volume loss (progressed since 03/17/2022), suspicious for underlying Neurodegenerative disease. 2. No acute or advanced for age chronic ischemic changes are identified.

## 2024-08-02 NOTE — Telephone Encounter (Signed)
 Patient has not been seen for this previously. Previously saw Dr. Onita in 2023 for cognitive impairment. Needs to be seen by Dr. Onita for new issues.

## 2024-08-03 ENCOUNTER — Telehealth: Payer: Self-pay

## 2024-08-03 NOTE — Telephone Encounter (Signed)
 Per harlene, NP patient needs to see Dr. Onita. Canceleed appt with harlene and made new apt with Dr. Onita.
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
## 2024-08-11 ENCOUNTER — Ambulatory Visit: Admitting: Adult Health

## 2024-08-11 ENCOUNTER — Encounter: Payer: Self-pay | Admitting: Family Medicine

## 2024-08-11 ENCOUNTER — Ambulatory Visit (INDEPENDENT_AMBULATORY_CARE_PROVIDER_SITE_OTHER): Admitting: Family Medicine

## 2024-08-11 VITALS — BP 122/80 | HR 80 | Ht 64.0 in | Wt 171.8 lb

## 2024-08-11 DIAGNOSIS — R42 Dizziness and giddiness: Secondary | ICD-10-CM | POA: Diagnosis not present

## 2024-08-12 ENCOUNTER — Encounter: Payer: Self-pay | Admitting: Urology

## 2024-08-12 ENCOUNTER — Ambulatory Visit: Admitting: Urology

## 2024-08-12 VITALS — BP 144/80 | HR 83

## 2024-08-12 DIAGNOSIS — R8290 Unspecified abnormal findings in urine: Secondary | ICD-10-CM

## 2024-08-12 DIAGNOSIS — N390 Urinary tract infection, site not specified: Secondary | ICD-10-CM | POA: Diagnosis not present

## 2024-08-12 DIAGNOSIS — Z87440 Personal history of urinary (tract) infections: Secondary | ICD-10-CM | POA: Diagnosis not present

## 2024-08-12 DIAGNOSIS — N3941 Urge incontinence: Secondary | ICD-10-CM | POA: Diagnosis not present

## 2024-08-12 LAB — URINALYSIS, ROUTINE W REFLEX MICROSCOPIC
Bilirubin, UA: NEGATIVE
Glucose, UA: NEGATIVE
Nitrite, UA: POSITIVE — AB
Protein,UA: NEGATIVE
Specific Gravity, UA: 1.02 (ref 1.005–1.030)
Urobilinogen, Ur: 0.2 mg/dL (ref 0.2–1.0)
pH, UA: 5.5 (ref 5.0–7.5)

## 2024-08-12 LAB — MICROSCOPIC EXAMINATION: WBC, UA: >30 /HPF — AB (ref 0–5)

## 2024-08-12 MED ORDER — CEFDINIR 300 MG PO CAPS: 300.0000 mg | ORAL_CAPSULE | Freq: Two times a day (BID) | ORAL | 0 refills | Status: AC

## 2024-08-12 MED ORDER — SOLIFENACIN SUCCINATE 10 MG PO TABS: 10.0000 mg | ORAL_TABLET | Freq: Every day | ORAL | 11 refills | Status: AC
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
## 2024-08-15 ENCOUNTER — Other Ambulatory Visit: Payer: Self-pay | Admitting: Family Medicine

## 2024-08-15 LAB — URINE CULTURE

## 2024-08-16 ENCOUNTER — Encounter: Payer: Self-pay | Admitting: Neurology

## 2024-08-16 ENCOUNTER — Telehealth: Payer: Self-pay | Admitting: Neurology

## 2024-08-16 ENCOUNTER — Ambulatory Visit: Admitting: Neurology

## 2024-08-16 ENCOUNTER — Ambulatory Visit: Payer: Self-pay | Admitting: Urology

## 2024-08-16 VITALS — BP 148/72 | HR 70 | Ht 64.0 in | Wt 172.5 lb

## 2024-08-16 DIAGNOSIS — N390 Urinary tract infection, site not specified: Secondary | ICD-10-CM

## 2024-08-16 DIAGNOSIS — R2689 Other abnormalities of gait and mobility: Secondary | ICD-10-CM | POA: Insufficient documentation

## 2024-08-16 MED ORDER — CEPHALEXIN 250 MG PO CAPS: 250.0000 mg | ORAL_CAPSULE | Freq: Every day | ORAL | 3 refills | Status: AC

## 2024-08-16 NOTE — Telephone Encounter (Signed)
 no auth required sent to GI [phone]
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
## 2024-08-30 ENCOUNTER — Ambulatory Visit
Admission: RE | Admit: 2024-08-30 | Discharge: 2024-08-30 | Disposition: A | Source: Ambulatory Visit | Attending: Neurology | Admitting: Neurology

## 2024-08-30 DIAGNOSIS — R2689 Other abnormalities of gait and mobility: Secondary | ICD-10-CM

## 2024-08-31 ENCOUNTER — Ambulatory Visit: Payer: Self-pay | Admitting: Neurology

## 2024-09-01 NOTE — Progress Notes (Signed)
 ok

## 2024-09-02 ENCOUNTER — Other Ambulatory Visit

## 2024-09-07 NOTE — Telephone Encounter (Signed)
 Pt called to request a call back about her MRI Results . Pt stated  that she is having a hard time with Phone and can't get in to Mychart . Pt would like to speak to someone.

## 2024-09-08 NOTE — Telephone Encounter (Signed)
-----   Message from Perla Simpers sent at 09/07/2024 12:23 PM EST -----
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
## 2024-09-09 NOTE — Progress Notes (Signed)
 Thank you :)

## 2024-09-24 ENCOUNTER — Encounter: Payer: Self-pay | Admitting: Family

## 2024-10-08 ENCOUNTER — Ambulatory Visit: Admitting: Urology

## 2025-06-23 ENCOUNTER — Ambulatory Visit
